# Patient Record
Sex: Female | Born: 1950
Health system: Southern US, Community
[De-identification: ages and names within clinical notes are randomized; demographics above are authoritative.]

## PROBLEM LIST (undated history)

## (undated) DIAGNOSIS — K219 Gastro-esophageal reflux disease without esophagitis: Secondary | ICD-10-CM

## (undated) DIAGNOSIS — A159 Respiratory tuberculosis unspecified: Secondary | ICD-10-CM

## (undated) DIAGNOSIS — M545 Low back pain, unspecified: Secondary | ICD-10-CM

## (undated) DIAGNOSIS — Z889 Allergy status to unspecified drugs, medicaments and biological substances status: Secondary | ICD-10-CM

## (undated) DIAGNOSIS — G8929 Other chronic pain: Secondary | ICD-10-CM

## (undated) DIAGNOSIS — G4730 Sleep apnea, unspecified: Secondary | ICD-10-CM

## (undated) DIAGNOSIS — M1990 Unspecified osteoarthritis, unspecified site: Secondary | ICD-10-CM

## (undated) DIAGNOSIS — E039 Hypothyroidism, unspecified: Secondary | ICD-10-CM

## (undated) HISTORY — PX: BREAST SURGERY: SHX581

## (undated) HISTORY — PX: COCHLEAR IMPLANT: SUR684

## (undated) HISTORY — PX: FRACTURE SURGERY: SHX138

## (undated) HISTORY — PX: NASAL SINUS SURGERY: SHX719

---

## 1999-06-11 ENCOUNTER — Encounter: Admission: RE | Admit: 1999-06-11 | Discharge: 1999-06-11 | Payer: Self-pay | Admitting: Neurosurgery

## 1999-07-31 ENCOUNTER — Encounter: Admission: RE | Admit: 1999-07-31 | Discharge: 1999-07-31 | Payer: Self-pay | Admitting: Neurosurgery

## 1999-08-09 ENCOUNTER — Ambulatory Visit (HOSPITAL_COMMUNITY): Admission: RE | Admit: 1999-08-09 | Discharge: 1999-08-09 | Payer: Self-pay | Admitting: Neurosurgery

## 2000-02-07 ENCOUNTER — Ambulatory Visit (HOSPITAL_COMMUNITY): Admission: RE | Admit: 2000-02-07 | Discharge: 2000-02-07 | Payer: Self-pay | Admitting: General Surgery

## 2000-02-07 ENCOUNTER — Encounter: Payer: Self-pay | Admitting: General Surgery

## 2001-09-17 ENCOUNTER — Encounter: Payer: Self-pay | Admitting: General Surgery

## 2001-09-17 ENCOUNTER — Encounter: Admission: RE | Admit: 2001-09-17 | Discharge: 2001-09-17 | Payer: Self-pay | Admitting: General Surgery

## 2003-09-14 ENCOUNTER — Observation Stay (HOSPITAL_COMMUNITY): Admission: RE | Admit: 2003-09-14 | Discharge: 2003-09-15 | Payer: Self-pay | Admitting: Surgery

## 2013-12-07 ENCOUNTER — Other Ambulatory Visit (HOSPITAL_COMMUNITY): Payer: Self-pay | Admitting: Interventional Radiology

## 2013-12-07 DIAGNOSIS — IMO0002 Reserved for concepts with insufficient information to code with codable children: Secondary | ICD-10-CM

## 2013-12-07 DIAGNOSIS — M549 Dorsalgia, unspecified: Secondary | ICD-10-CM

## 2013-12-09 ENCOUNTER — Ambulatory Visit (HOSPITAL_COMMUNITY)
Admission: RE | Admit: 2013-12-09 | Discharge: 2013-12-09 | Disposition: A | Discharge status: Home / Self Care | Payer: BC Managed Care – PPO | Source: Ambulatory Visit | Attending: Interventional Radiology | Admitting: Interventional Radiology

## 2013-12-09 DIAGNOSIS — M549 Dorsalgia, unspecified: Secondary | ICD-10-CM

## 2013-12-09 DIAGNOSIS — IMO0002 Reserved for concepts with insufficient information to code with codable children: Secondary | ICD-10-CM

## 2014-01-18 ENCOUNTER — Other Ambulatory Visit (HOSPITAL_COMMUNITY): Payer: Self-pay | Admitting: Interventional Radiology

## 2014-01-18 DIAGNOSIS — T148XXA Other injury of unspecified body region, initial encounter: Secondary | ICD-10-CM

## 2014-01-18 DIAGNOSIS — M549 Dorsalgia, unspecified: Secondary | ICD-10-CM

## 2014-01-24 ENCOUNTER — Encounter (HOSPITAL_COMMUNITY)
Admission: RE | Admit: 2014-01-24 | Discharge: 2014-01-24 | Disposition: A | Discharge status: Home / Self Care | Payer: BC Managed Care – PPO | Source: Ambulatory Visit | Attending: Interventional Radiology | Admitting: Interventional Radiology

## 2014-01-24 DIAGNOSIS — M549 Dorsalgia, unspecified: Secondary | ICD-10-CM

## 2014-01-24 DIAGNOSIS — T148XXA Other injury of unspecified body region, initial encounter: Secondary | ICD-10-CM

## 2014-01-24 MED ORDER — TECHNETIUM TC 99M MEDRONATE IV KIT
25.0000 | PACK | Freq: Once | INTRAVENOUS | Status: AC | PRN
  Administered 2014-01-24: 25 via INTRAVENOUS

## 2014-01-26 ENCOUNTER — Telehealth (HOSPITAL_COMMUNITY): Payer: Self-pay | Admitting: Interventional Radiology

## 2014-01-26 NOTE — Telephone Encounter (Signed)
Called pt, no answer and no VM JM 

## 2014-03-09 ENCOUNTER — Telehealth (HOSPITAL_COMMUNITY): Payer: Self-pay | Admitting: Interventional Radiology

## 2014-03-09 NOTE — Telephone Encounter (Signed)
Called pt, gave her the results of her recent bone scan. Told her that per Deveshwar she should see her PCP, no new fractures. She states understanding and is in agreement w/ this plan of care. JM

## 2014-05-03 ENCOUNTER — Other Ambulatory Visit (HOSPITAL_COMMUNITY): Payer: Self-pay | Admitting: Pain Medicine

## 2014-05-03 DIAGNOSIS — T148XXA Other injury of unspecified body region, initial encounter: Secondary | ICD-10-CM

## 2014-05-11 ENCOUNTER — Encounter (HOSPITAL_COMMUNITY): Payer: BC Managed Care – PPO

## 2014-05-12 ENCOUNTER — Ambulatory Visit (HOSPITAL_COMMUNITY)
Admission: RE | Admit: 2014-05-12 | Discharge: 2014-05-12 | Disposition: A | Discharge status: Home / Self Care | Payer: BC Managed Care – PPO | Source: Ambulatory Visit | Attending: Pain Medicine | Admitting: Pain Medicine

## 2014-05-12 DIAGNOSIS — M549 Dorsalgia, unspecified: Secondary | ICD-10-CM | POA: Insufficient documentation

## 2014-05-12 DIAGNOSIS — T148XXA Other injury of unspecified body region, initial encounter: Secondary | ICD-10-CM

## 2014-05-12 MED ORDER — TECHNETIUM TC 99M MEDRONATE IV KIT
24.5000 | PACK | Freq: Once | INTRAVENOUS | Status: AC | PRN
  Administered 2014-05-12: 24.5 via INTRAVENOUS

## 2016-01-14 ENCOUNTER — Inpatient Hospital Stay: Admit: 2016-01-14 | Payer: Self-pay | Admitting: Orthopedic Surgery

## 2016-01-14 SURGERY — RELEASE, BURSA, TROCHANTERIC
Anesthesia: Spinal | Site: Hip | Laterality: Right

## 2016-05-14 NOTE — H&P (Signed)
Shannon Conner is an 65 y.o. female.    Chief Complaint: Right hip troch bursitis and gluteal tendon tear   Procedure:   Right hip open bursectomy and gluteal tendon repair  HPI: Pt is a 65 y.o. female complaining of right hip pain for 10-12 years. Pain had continually increased since the beginning. Imaging studies reveal  right hip gluteal tendon tear. Pt has tried various conservative treatments which have failed to alleviate their symptoms, including NSAIDs, cortisone injections and activity modification. Various options are discussed with the patient. Risks, benefits and expectations were discussed with the patient. Patient understand the risks, benefits and expectations and wishes to proceed with surgery.    PCP: Lucila [place], MD  D/C Plans:      Home  Post-op Meds:       No Rx given  Tranexamic Acid:      To be given - IV   Decadron:      Is to be given  FYI:     ASA  Norco    Past Medical History:  Diagnosis Date  . Arthritis    RA  . Chronic right-sided low back pain   . GERD (gastroesophageal reflux disease)   . History of seasonal allergies   . Hypothyroidism   . Sleep apnea    "mild"- no tx  . Tuberculosis       Past Surgical History:  Procedure Laterality Date  . BREAST SURGERY Bilateral    cyst removal one, "tumor removed from the other"  . COCHLEAR IMPLANT Right    x 2  . FRACTURE SURGERY     nose  . NASAL SINUS SURGERY     x 4/states sever back pain following this most recent one- 9/17    Social History:  reports that she has quit smoking. She has never used smokeless tobacco. She reports that she does not drink alcohol or use drugs.   Allergies  Allergen Reactions  . Ibuprofen Palpitations and Shortness Of Breath  . Meclofenamate Shortness Of Breath  . Propoxyphene Shortness Of Breath, Palpitations and Other (See Comments)  . Acetaminophen     Decreased liver function    Medications: No current facility-administered medications  for this encounter.    Current Outpatient Prescriptions  Medication Sig Dispense Refill  . Abatacept (ORENCIA) 125 MG/ML SOSY Inject 125 mg into the skin once a week.    Marland Kitchen amitriptyline (ELAVIL) 50 MG tablet Take 50 mg by mouth at bedtime.    . Calcium-Magnesium-Vitamin D (CALCIUM 1200+D3 PO) Take 1 tablet by mouth at bedtime.    . Cholecalciferol (VITAMIN D3) 2000 units TABS Take 2,000 Units by mouth daily.    . fluticasone (FLONASE) 50 MCG/ACT nasal spray Place 1-2 sprays into both nostrils daily as needed for allergies or rhinitis.    Marland Kitchen gabapentin (NEURONTIN) 600 MG tablet Take 600 mg by mouth at bedtime.    Marland Kitchen levothyroxine (SYNTHROID, LEVOTHROID) 88 MCG tablet Take 88 mcg by mouth daily before breakfast.    . loratadine (CLARITIN) 10 MG tablet Take 10 mg by mouth daily as needed for allergies.    . Multiple Vitamins-Minerals (ICAPS AREDS 2) CAPS Take 1 capsule by mouth 2 (two) times daily.    . naproxen sodium (ANAPROX) 220 MG tablet Take 220-440 mg by mouth daily as needed (pain).    Marland Kitchen omeprazole (PRILOSEC) 20 MG capsule Take 20 mg by mouth 2 (two) times daily.    . polycarbophil (FIBERCON) 625 MG tablet Take  625 mg by mouth 2 (two) times daily.    Bertram Gala Glycol-Propyl Glycol (SYSTANE ULTRA OP) Apply 1 drop to eye daily as needed (dry eyes).    . risedronate (ACTONEL) 150 MG tablet Take 150 mg by mouth every 30 (thirty) days. On the 17th of each month    . rosuvastatin (CRESTOR) 20 MG tablet Take 20 mg by mouth every evening.    . sodium chloride (OCEAN) 0.65 % SOLN nasal spray Place 1 spray into both nostrils as needed for congestion.    Marland Kitchen tiZANidine (ZANAFLEX) 4 MG tablet Take 4 mg by mouth 3 (three) times daily as needed for muscle spasms.   5  . vitamin B-12 (CYANOCOBALAMIN) 1000 MCG tablet Take 1,000 mcg by mouth every 30 (thirty) days.         Review of Systems  Constitutional: Negative.   HENT: Positive for hearing loss.   Eyes: Negative.   Respiratory: Negative.     Cardiovascular: Negative.   Gastrointestinal: Positive for heartburn.  Genitourinary: Negative.   Musculoskeletal: Positive for back pain and joint pain.  Skin: Negative.   Neurological: Negative.   Endo/Heme/Allergies: Positive for environmental allergies.  Psychiatric/Behavioral: Negative.        Physical Exam  Constitutional: She is oriented to person, place, and time. She appears well-developed.  HENT:  Head: Normocephalic.  Eyes: Pupils are equal, round, and reactive to light.  Neck: Neck supple. No JVD present. No tracheal deviation present. No thyromegaly present.  Cardiovascular: Normal rate, regular rhythm, normal heart sounds and intact distal pulses.   Respiratory: Effort normal and breath sounds normal. No respiratory distress. She has no wheezes.  GI: Soft. There is no tenderness. There is no guarding.  Musculoskeletal:       Right hip: She exhibits decreased range of motion, decreased strength, tenderness and bony tenderness. She exhibits no swelling, no deformity and no laceration.  Lymphadenopathy:    She has no cervical adenopathy.  Neurological: She is alert and oriented to person, place, and time.  Skin: Skin is warm and dry.  Psychiatric: She has a normal mood and affect.       Assessment/Plan Assessment: Right hip troch bursitis and gluteal tendon tear   Plan: Patient will undergo a  right hip open bursectomy and gluteal tendon repair on 05/26/2016 per Dr. Charlann Boxer at [facility]. Risks benefits and expectations were discussed with the patient. Patient understand risks, benefits and expectations and wishes to proceed.    Anastasio Auerbach Deshanti Adcox   PA-C  05/21/2016, 9:55 AM

## 2016-05-16 NOTE — Patient Instructions (Addendum)
Shannon Conner    Your procedure is scheduled on: 05/26/16  Report to [facility] Main  Entrance take [facility]  elevators to 3rd floor to  Short Stay Center at  0900 AM.  Call this number if you have problems the morning of surgery [phone]   Remember: ONLY 1 PERSON MAY GO WITH YOU TO SHORT STAY TO GET  READY MORNING OF YOUR SURGERY.  Do not eat food or drink liquids :After Midnight.     Take these medicines the morning of surgery with A SIP OF WATER:Levothyrexine , Omepazole May take  Clairitin if needed, Tinaflex, or use nasal spray if needed DO NOT TAKE ANY DIABETIC MEDICATIONS DAY OF YOUR SURGERY                               You may not have any metal on your body including hair pins and              piercings  Do not wear jewelry, make-up, lotions, powders or perfumes, deodorant             Do not wear nail polish.  Do not shave  48 hours prior to surgery.              Men may shave face and neck.   Do not bring valuables to the hospital. [place] IS NOT             RESPONSIBLE   FOR VALUABLES.  Contacts, dentures or bridgework may not be worn into surgery.  Leave suitcase in the car. After surgery it may be brought to your room.             Please read over the following fact sheets you were given: _____________________________________________________________________             [facility] - Preparing for Surgery Before surgery, you can play an important role.  Because skin is not sterile, your skin needs to be as free of germs as possible.  You can reduce the number of germs on your skin by washing with CHG (chlorahexidine gluconate) soap before surgery.  CHG is an antiseptic cleaner which kills germs and bonds with the skin to continue killing germs even after washing. Please DO NOT use if you have an allergy to CHG or antibacterial soaps.  If your skin becomes reddened/irritated stop using the CHG and inform your nurse when you arrive at Short  Stay. Do not shave (including legs and underarms) for at least 48 hours prior to the first CHG shower.  You may shave your face/neck. Please follow these instructions carefully:  1.  Shower with CHG Soap the night before surgery and the  morning of Surgery.  2.  If you choose to wash your hair, wash your hair first as usual with your  normal  shampoo.  3.  After you shampoo, rinse your hair and body thoroughly to remove the  shampoo.                           4.  Use CHG as you would any other liquid soap.  You can apply chg directly  to the skin and wash  Gently with a scrungie or clean washcloth.  5.  Apply the CHG Soap to your body ONLY FROM THE NECK DOWN.   Do not use on face/ open                           Wound or open sores. Avoid contact with eyes, ears mouth and genitals (private parts).                       Wash face,  Genitals (private parts) with your normal soap.             6.  Wash thoroughly, paying special attention to the area where your surgery  will be performed.  7.  Thoroughly rinse your body with warm water from the neck down.  8.  DO NOT shower/wash with your normal soap after using and rinsing off  the CHG Soap.                9.  Pat yourself dry with a clean towel.            10.  Wear clean pajamas.            11.  Place clean sheets on your bed the night of your first shower and do not  sleep with pets. Day of Surgery : Do not apply any lotions/deodorants the morning of surgery.  Please wear clean clothes to the hospital/surgery center.  FAILURE TO FOLLOW THESE INSTRUCTIONS MAY RESULT IN THE CANCELLATION OF YOUR SURGERY PATIENT SIGNATURE_________________________________  NURSE SIGNATURE__________________________________  ________________________________________________________________________   Adam Phenix  An incentive spirometer is a tool that can help keep your lungs clear and active. This tool measures how well you are  filling your lungs with each breath. Taking long deep breaths may help reverse or decrease the chance of developing breathing (pulmonary) problems (especially infection) following:  A long period of time when you are unable to move or be active. BEFORE THE PROCEDURE   If the spirometer includes an indicator to show your best effort, your nurse or respiratory therapist will set it to a desired goal.  If possible, sit up straight or lean slightly forward. Try not to slouch.  Hold the incentive spirometer in an upright position. INSTRUCTIONS FOR USE  1. Sit on the edge of your bed if possible, or sit up as far as you can in bed or on a chair. 2. Hold the incentive spirometer in an upright position. 3. Breathe out normally. 4. Place the mouthpiece in your mouth and seal your lips tightly around it. 5. Breathe in slowly and as deeply as possible, raising the piston or the ball toward the top of the column. 6. Hold your breath for 3-5 seconds or for as long as possible. Allow the piston or ball to fall to the bottom of the column. 7. Remove the mouthpiece from your mouth and breathe out normally. 8. Rest for a few seconds and repeat Steps 1 through 7 at least 10 times every 1-2 hours when you are awake. Take your time and take a few normal breaths between deep breaths. 9. The spirometer may include an indicator to show your best effort. Use the indicator as a goal to work toward during each repetition. 10. After each set of 10 deep breaths, practice coughing to be sure your lungs are clear. If you have an incision (the cut made at the time of surgery),  support your incision when coughing by placing a pillow or rolled up towels firmly against it. Once you are able to get out of bed, walk around indoors and cough well. You may stop using the incentive spirometer when instructed by your caregiver.  RISKS AND COMPLICATIONS  Take your time so you do not get dizzy or light-headed.  If you are in pain,  you may need to take or ask for pain medication before doing incentive spirometry. It is harder to take a deep breath if you are having pain. AFTER USE  Rest and breathe slowly and easily.  It can be helpful to keep track of a log of your progress. Your caregiver can provide you with a simple table to help with this. If you are using the spirometer at home, follow these instructions: [place] IF:   You are having difficultly using the spirometer.  You have trouble using the spirometer as often as instructed.  Your pain medication is not giving enough relief while using the spirometer.  You develop fever of 100.5 F (38.1 C) or higher. SEEK IMMEDIATE MEDICAL CARE IF:   You cough up bloody sputum that had not been present before.  You develop fever of 102 F (38.9 C) or greater.  You develop worsening pain at or near the incision site. MAKE SURE YOU:   Understand these instructions.  Will watch your condition.  Will get help right away if you are not doing well or get worse. Document Released: 11/10/2006 Document Revised: 09/22/2011 Document Reviewed: 01/11/2007 ExitCare Patient Information 2014 [employer], [place].   ________________________________________________________________________  WHAT IS A BLOOD TRANSFUSION? Blood Transfusion Information  A transfusion is the replacement of blood or some of its parts. Blood is made up of multiple cells which provide different functions.  Red blood cells carry oxygen and are used for blood loss replacement.  White blood cells fight against infection.  Platelets control bleeding.  Plasma helps clot blood.  Other blood products are available for specialized needs, such as hemophilia or other clotting disorders. BEFORE THE TRANSFUSION  Who gives blood for transfusions?   Healthy volunteers who are fully evaluated to make sure their blood is safe. This is blood bank blood. Transfusion therapy is the safest it has ever  been in the practice of medicine. Before blood is taken from a donor, a complete history is taken to make sure that person has no history of diseases nor engages in risky social behavior (examples are intravenous drug use or sexual activity with multiple partners). The donor's travel history is screened to minimize risk of transmitting infections, such as malaria. The donated blood is tested for signs of infectious diseases, such as HIV and hepatitis. The blood is then tested to be sure it is compatible with you in order to minimize the chance of a transfusion reaction. If you or a relative donates blood, this is often done in anticipation of surgery and is not appropriate for emergency situations. It takes many days to process the donated blood. RISKS AND COMPLICATIONS Although transfusion therapy is very safe and saves many lives, the main dangers of transfusion include:   Getting an infectious disease.  Developing a transfusion reaction. This is an allergic reaction to something in the blood you were given. Every precaution is taken to prevent this. The decision to have a blood transfusion has been considered carefully by your caregiver before blood is given. Blood is not given unless the benefits outweigh the risks. AFTER THE TRANSFUSION  Right after receiving a blood transfusion, you will usually feel much better and more energetic. This is especially true if your red blood cells have gotten low (anemic). The transfusion raises the level of the red blood cells which carry oxygen, and this usually causes an energy increase.  The nurse administering the transfusion will monitor you carefully for complications. HOME CARE INSTRUCTIONS  No special instructions are needed after a transfusion. You may find your energy is better. Speak with your caregiver about any limitations on activity for underlying diseases you may have. SEEK MEDICAL CARE IF:   Your condition is not improving after your  transfusion.  You develop redness or irritation at the intravenous (IV) site. SEEK IMMEDIATE MEDICAL CARE IF:  Any of the following symptoms occur over the next 12 hours:  Shaking chills.  You have a temperature by mouth above 102 F (38.9 C), not controlled by medicine.  Chest, back, or muscle pain.  People around you feel you are not acting correctly or are confused.  Shortness of breath or difficulty breathing.  Dizziness and fainting.  You get a rash or develop hives.  You have a decrease in urine output.  Your urine turns a dark color or changes to pink, red, or brown. Any of the following symptoms occur over the next 10 days:  You have a temperature by mouth above 102 F (38.9 C), not controlled by medicine.  Shortness of breath.  Weakness after normal activity.  The white part of the eye turns yellow (jaundice).  You have a decrease in the amount of urine or are urinating less often.  Your urine turns a dark color or changes to pink, red, or brown. Document Released: 06/27/2000 Document Revised: 09/22/2011 Document Reviewed: 02/14/2008 [facility] Patient Information 2014 [place], [place].  _______________________________________________________________________

## 2016-05-19 ENCOUNTER — Encounter (HOSPITAL_COMMUNITY): Payer: Self-pay

## 2016-05-19 ENCOUNTER — Encounter (HOSPITAL_COMMUNITY)
Admission: RE | Admit: 2016-05-19 | Discharge: 2016-05-19 | Disposition: A | Discharge status: Home / Self Care | Payer: BLUE CROSS/BLUE SHIELD | Source: Ambulatory Visit | Attending: Orthopedic Surgery | Admitting: Orthopedic Surgery

## 2016-05-19 DIAGNOSIS — Z01812 Encounter for preprocedural laboratory examination: Secondary | ICD-10-CM | POA: Insufficient documentation

## 2016-05-19 HISTORY — DX: Gastro-esophageal reflux disease without esophagitis: K21.9

## 2016-05-19 HISTORY — DX: Hypothyroidism, unspecified: E03.9

## 2016-05-19 HISTORY — DX: Unspecified osteoarthritis, unspecified site: M19.90

## 2016-05-19 HISTORY — DX: Respiratory tuberculosis unspecified: A15.9

## 2016-05-19 HISTORY — DX: Sleep apnea, unspecified: G47.30

## 2016-05-19 HISTORY — DX: Allergy status to unspecified drugs, medicaments and biological substances: Z88.9

## 2016-05-19 HISTORY — DX: Low back pain, unspecified: M54.50

## 2016-05-19 HISTORY — DX: Other chronic pain: G89.29

## 2016-05-19 HISTORY — DX: Low back pain: M54.5

## 2016-05-19 LAB — CBC
HCT: 40.4 % (ref 36.0–46.0)
HEMOGLOBIN: 12.9 g/dL (ref 12.0–15.0)
MCH: 29.1 pg (ref 26.0–34.0)
MCHC: 31.9 g/dL (ref 30.0–36.0)
MCV: 91 fL (ref 78.0–100.0)
Platelets: 222 10*3/uL (ref 150–400)
RBC: 4.44 MIL/uL (ref 3.87–5.11)
RDW: 13.5 % (ref 11.5–15.5)
WBC: 5.9 10*3/uL (ref 4.0–10.5)

## 2016-05-19 LAB — ABO/RH: ABO/RH(D): A POS

## 2016-05-19 NOTE — Progress Notes (Signed)
Chest, ekg, lov dr Lorin Picket on chart

## 2016-05-26 ENCOUNTER — Inpatient Hospital Stay (HOSPITAL_COMMUNITY): Payer: BLUE CROSS/BLUE SHIELD | Admitting: Certified Registered Nurse Anesthetist

## 2016-05-26 ENCOUNTER — Observation Stay (HOSPITAL_COMMUNITY)
Admission: RE | Admit: 2016-05-26 | Discharge: 2016-05-27 | Disposition: A | Discharge status: Home / Self Care | Payer: BLUE CROSS/BLUE SHIELD | Source: Ambulatory Visit | Attending: Orthopedic Surgery | Admitting: Orthopedic Surgery

## 2016-05-26 ENCOUNTER — Encounter (HOSPITAL_COMMUNITY): Payer: Self-pay

## 2016-05-26 ENCOUNTER — Encounter (HOSPITAL_COMMUNITY)
Admission: RE | Disposition: A | Discharge status: Home / Self Care | Payer: Self-pay | Source: Ambulatory Visit | Attending: Orthopedic Surgery

## 2016-05-26 DIAGNOSIS — M62838 Other muscle spasm: Secondary | ICD-10-CM | POA: Insufficient documentation

## 2016-05-26 DIAGNOSIS — M069 Rheumatoid arthritis, unspecified: Secondary | ICD-10-CM | POA: Diagnosis not present

## 2016-05-26 DIAGNOSIS — M898X8 Other specified disorders of bone, other site: Secondary | ICD-10-CM | POA: Insufficient documentation

## 2016-05-26 DIAGNOSIS — Z886 Allergy status to analgesic agent status: Secondary | ICD-10-CM | POA: Diagnosis not present

## 2016-05-26 DIAGNOSIS — E039 Hypothyroidism, unspecified: Secondary | ICD-10-CM | POA: Insufficient documentation

## 2016-05-26 DIAGNOSIS — G4730 Sleep apnea, unspecified: Secondary | ICD-10-CM | POA: Diagnosis not present

## 2016-05-26 DIAGNOSIS — M7061 Trochanteric bursitis, right hip: Secondary | ICD-10-CM | POA: Diagnosis not present

## 2016-05-26 DIAGNOSIS — K219 Gastro-esophageal reflux disease without esophagitis: Secondary | ICD-10-CM | POA: Insufficient documentation

## 2016-05-26 DIAGNOSIS — Z8611 Personal history of tuberculosis: Secondary | ICD-10-CM | POA: Diagnosis not present

## 2016-05-26 DIAGNOSIS — S76011D Strain of muscle, fascia and tendon of right hip, subsequent encounter: Secondary | ICD-10-CM

## 2016-05-26 DIAGNOSIS — M6289 Other specified disorders of muscle: Secondary | ICD-10-CM | POA: Insufficient documentation

## 2016-05-26 DIAGNOSIS — S76011A Strain of muscle, fascia and tendon of right hip, initial encounter: Secondary | ICD-10-CM

## 2016-05-26 DIAGNOSIS — Z7982 Long term (current) use of aspirin: Secondary | ICD-10-CM | POA: Diagnosis not present

## 2016-05-26 DIAGNOSIS — Z888 Allergy status to other drugs, medicaments and biological substances status: Secondary | ICD-10-CM | POA: Diagnosis not present

## 2016-05-26 DIAGNOSIS — H04123 Dry eye syndrome of bilateral lacrimal glands: Secondary | ICD-10-CM | POA: Diagnosis not present

## 2016-05-26 HISTORY — DX: Strain of muscle, fascia and tendon of right hip, initial encounter: S76.011A

## 2016-05-26 HISTORY — PX: EXCISION/RELEASE BURSA HIP: SHX5014

## 2016-05-26 LAB — TYPE AND SCREEN
ABO/RH(D): A POS
ANTIBODY SCREEN: NEGATIVE

## 2016-05-26 SURGERY — RELEASE, BURSA, TROCHANTERIC
Anesthesia: General | Site: Hip | Laterality: Right

## 2016-05-26 MED ORDER — MIDAZOLAM HCL 5 MG/5ML IJ SOLN
INTRAMUSCULAR | Status: DC | PRN
  Administered 2016-05-26: 2 mg via INTRAVENOUS

## 2016-05-26 MED ORDER — POLYETHYLENE GLYCOL 3350 17 G PO PACK
17.0000 g | PACK | Freq: Two times a day (BID) | ORAL | Status: DC
  Administered 2016-05-26 – 2016-05-27 (×2): 17 g via ORAL
  Filled 2016-05-26 (×2): qty 1

## 2016-05-26 MED ORDER — SODIUM CHLORIDE 0.9 % IV SOLN
100.0000 mL/h | INTRAVENOUS | Status: DC
  Administered 2016-05-26 – 2016-05-27 (×2): 100 mL/h via INTRAVENOUS
  Filled 2016-05-26 (×6): qty 1000

## 2016-05-26 MED ORDER — METHOCARBAMOL 500 MG PO TABS: 500.0000 mg | ORAL_TABLET | Freq: Four times a day (QID) | ORAL | Status: DC | PRN

## 2016-05-26 MED ORDER — NON FORMULARY: 20.0000 mg | Freq: Two times a day (BID) | Status: DC

## 2016-05-26 MED ORDER — FLUTICASONE PROPIONATE 50 MCG/ACT NA SUSP
1.0000 | Freq: Every day | NASAL | Status: DC | PRN
  Filled 2016-05-26: qty 16

## 2016-05-26 MED ORDER — PROPOFOL 10 MG/ML IV BOLUS
INTRAVENOUS | Status: DC | PRN
  Administered 2016-05-26: 140 mg via INTRAVENOUS

## 2016-05-26 MED ORDER — MENTHOL 3 MG MT LOZG
1.0000 | LOZENGE | OROMUCOSAL | Status: DC | PRN
  Filled 2016-05-26: qty 9

## 2016-05-26 MED ORDER — AMITRIPTYLINE HCL 50 MG PO TABS
50.0000 mg | ORAL_TABLET | Freq: Every day | ORAL | Status: DC
  Administered 2016-05-26: 50 mg via ORAL
  Filled 2016-05-26: qty 1

## 2016-05-26 MED ORDER — PHENOL 1.4 % MT LIQD: 1.0000 | OROMUCOSAL | Status: DC | PRN

## 2016-05-26 MED ORDER — 0.9 % SODIUM CHLORIDE (POUR BTL) OPTIME
TOPICAL | Status: DC | PRN
  Administered 2016-05-26: 1000 mL

## 2016-05-26 MED ORDER — EPHEDRINE SULFATE 50 MG/ML IJ SOLN
INTRAMUSCULAR | Status: DC | PRN
  Administered 2016-05-26: 15 mg via INTRAVENOUS
  Administered 2016-05-26: 10 mg via INTRAVENOUS
  Administered 2016-05-26: 15 mg via INTRAVENOUS
  Administered 2016-05-26 (×3): 10 mg via INTRAVENOUS

## 2016-05-26 MED ORDER — PHENYLEPHRINE HCL 10 MG/ML IJ SOLN
INTRAMUSCULAR | Status: DC | PRN
  Administered 2016-05-26: 80 ug via INTRAVENOUS
  Administered 2016-05-26: 120 ug via INTRAVENOUS
  Administered 2016-05-26: 80 ug via INTRAVENOUS

## 2016-05-26 MED ORDER — DEXAMETHASONE SODIUM PHOSPHATE 10 MG/ML IJ SOLN
10.0000 mg | Freq: Once | INTRAMUSCULAR | Status: AC
  Administered 2016-05-26: 10 mg via INTRAVENOUS

## 2016-05-26 MED ORDER — ALUM & MAG HYDROXIDE-SIMETH 200-200-20 MG/5ML PO SUSP: 30.0000 mL | ORAL | Status: DC | PRN

## 2016-05-26 MED ORDER — GABAPENTIN 300 MG PO CAPS
600.0000 mg | ORAL_CAPSULE | Freq: Every day | ORAL | Status: DC
  Administered 2016-05-26: 600 mg via ORAL
  Filled 2016-05-26: qty 2

## 2016-05-26 MED ORDER — LACTATED RINGERS IV SOLN
INTRAVENOUS | Status: DC
  Administered 2016-05-26: 11:00:00 via INTRAVENOUS

## 2016-05-26 MED ORDER — ASPIRIN 81 MG PO CHEW
81.0000 mg | CHEWABLE_TABLET | Freq: Two times a day (BID) | ORAL | Status: DC
  Administered 2016-05-26 – 2016-05-27 (×2): 81 mg via ORAL
  Filled 2016-05-26 (×2): qty 1

## 2016-05-26 MED ORDER — FENTANYL CITRATE (PF) 100 MCG/2ML IJ SOLN
25.0000 ug | INTRAMUSCULAR | Status: DC | PRN
  Administered 2016-05-26 (×2): 50 ug via INTRAVENOUS

## 2016-05-26 MED ORDER — BISACODYL 10 MG RE SUPP: 10.0000 mg | Freq: Every day | RECTAL | Status: DC | PRN

## 2016-05-26 MED ORDER — HYDROMORPHONE HCL 1 MG/ML IJ SOLN
0.5000 mg | INTRAMUSCULAR | Status: DC | PRN
  Administered 2016-05-26: 1 mg via INTRAVENOUS
  Filled 2016-05-26: qty 1

## 2016-05-26 MED ORDER — DIPHENHYDRAMINE HCL 25 MG PO CAPS: 25.0000 mg | ORAL_CAPSULE | Freq: Four times a day (QID) | ORAL | Status: DC | PRN

## 2016-05-26 MED ORDER — SUGAMMADEX SODIUM 500 MG/5ML IV SOLN
INTRAVENOUS | Status: DC | PRN
  Administered 2016-05-26: 300 mg via INTRAVENOUS

## 2016-05-26 MED ORDER — METHOCARBAMOL 1000 MG/10ML IJ SOLN
500.0000 mg | Freq: Four times a day (QID) | INTRAVENOUS | Status: DC | PRN
  Administered 2016-05-26: 500 mg via INTRAVENOUS
  Filled 2016-05-26: qty 5
  Filled 2016-05-26: qty 550

## 2016-05-26 MED ORDER — DOCUSATE SODIUM 100 MG PO CAPS
100.0000 mg | ORAL_CAPSULE | Freq: Two times a day (BID) | ORAL | Status: DC
  Administered 2016-05-26 – 2016-05-27 (×2): 100 mg via ORAL
  Filled 2016-05-26 (×2): qty 1

## 2016-05-26 MED ORDER — CEFAZOLIN SODIUM-DEXTROSE 2-4 GM/100ML-% IV SOLN
2.0000 g | Freq: Four times a day (QID) | INTRAVENOUS | Status: AC
  Administered 2016-05-26 – 2016-05-27 (×2): 2 g via INTRAVENOUS
  Filled 2016-05-26 (×2): qty 100

## 2016-05-26 MED ORDER — ONDANSETRON HCL 4 MG PO TABS: 4.0000 mg | ORAL_TABLET | Freq: Four times a day (QID) | ORAL | Status: DC | PRN

## 2016-05-26 MED ORDER — SODIUM CHLORIDE 0.9 % IV SOLN
1000.0000 mg | INTRAVENOUS | Status: AC
  Administered 2016-05-26: 1000 mg via INTRAVENOUS
  Filled 2016-05-26: qty 10

## 2016-05-26 MED ORDER — MEPERIDINE HCL 50 MG/ML IJ SOLN: 6.2500 mg | INTRAMUSCULAR | Status: DC | PRN

## 2016-05-26 MED ORDER — ONDANSETRON HCL 4 MG/2ML IJ SOLN
INTRAMUSCULAR | Status: DC | PRN
  Administered 2016-05-26: 4 mg via INTRAVENOUS

## 2016-05-26 MED ORDER — FERROUS SULFATE 325 (65 FE) MG PO TABS
325.0000 mg | ORAL_TABLET | Freq: Three times a day (TID) | ORAL | Status: DC
  Administered 2016-05-26 – 2016-05-27 (×3): 325 mg via ORAL
  Filled 2016-05-26 (×3): qty 1

## 2016-05-26 MED ORDER — MAGNESIUM CITRATE PO SOLN
1.0000 | Freq: Once | ORAL | Status: DC | PRN
Start: 2016-05-26 — End: 2016-05-27

## 2016-05-26 MED ORDER — CEFAZOLIN SODIUM-DEXTROSE 2-4 GM/100ML-% IV SOLN
INTRAVENOUS | Status: AC
  Filled 2016-05-26: qty 100

## 2016-05-26 MED ORDER — METOCLOPRAMIDE HCL 5 MG PO TABS: 5.0000 mg | ORAL_TABLET | Freq: Three times a day (TID) | ORAL | Status: DC | PRN

## 2016-05-26 MED ORDER — LACTATED RINGERS IV SOLN: INTRAVENOUS | Status: DC

## 2016-05-26 MED ORDER — LEVOTHYROXINE SODIUM 88 MCG PO TABS
88.0000 ug | ORAL_TABLET | Freq: Every day | ORAL | Status: DC
  Administered 2016-05-27: 88 ug via ORAL
  Filled 2016-05-26: qty 1

## 2016-05-26 MED ORDER — FENTANYL CITRATE (PF) 100 MCG/2ML IJ SOLN
INTRAMUSCULAR | Status: DC | PRN
  Administered 2016-05-26 (×3): 50 ug via INTRAVENOUS

## 2016-05-26 MED ORDER — FENTANYL CITRATE (PF) 100 MCG/2ML IJ SOLN
INTRAMUSCULAR | Status: AC
  Filled 2016-05-26: qty 2

## 2016-05-26 MED ORDER — METOCLOPRAMIDE HCL 5 MG/ML IJ SOLN: 10.0000 mg | Freq: Once | INTRAMUSCULAR | Status: DC | PRN

## 2016-05-26 MED ORDER — OMEPRAZOLE 20 MG PO CPDR
20.0000 mg | DELAYED_RELEASE_CAPSULE | Freq: Two times a day (BID) | ORAL | Status: DC
  Administered 2016-05-26 – 2016-05-27 (×2): 20 mg via ORAL
  Filled 2016-05-26 (×2): qty 1

## 2016-05-26 MED ORDER — OXYCODONE HCL 5 MG PO TABS
5.0000 mg | ORAL_TABLET | ORAL | Status: DC
  Administered 2016-05-26: 5 mg via ORAL
  Administered 2016-05-26 – 2016-05-27 (×5): 10 mg via ORAL
  Filled 2016-05-26 (×5): qty 2
  Filled 2016-05-26: qty 1

## 2016-05-26 MED ORDER — LORATADINE 10 MG PO TABS: 10.0000 mg | ORAL_TABLET | Freq: Every day | ORAL | Status: DC | PRN

## 2016-05-26 MED ORDER — ROSUVASTATIN CALCIUM 20 MG PO TABS
20.0000 mg | ORAL_TABLET | Freq: Every evening | ORAL | Status: DC
  Administered 2016-05-26: 20 mg via ORAL
  Filled 2016-05-26: qty 1

## 2016-05-26 MED ORDER — DEXAMETHASONE SODIUM PHOSPHATE 10 MG/ML IJ SOLN
10.0000 mg | Freq: Once | INTRAMUSCULAR | Status: AC
  Administered 2016-05-27: 10 mg via INTRAVENOUS
  Filled 2016-05-26: qty 1

## 2016-05-26 MED ORDER — CEFAZOLIN SODIUM-DEXTROSE 2-4 GM/100ML-% IV SOLN
2.0000 g | INTRAVENOUS | Status: AC
  Administered 2016-05-26: 2 g via INTRAVENOUS

## 2016-05-26 MED ORDER — CHLORHEXIDINE GLUCONATE 4 % EX LIQD: 60.0000 mL | Freq: Once | CUTANEOUS | Status: DC

## 2016-05-26 MED ORDER — ONDANSETRON HCL 4 MG/2ML IJ SOLN: 4.0000 mg | Freq: Four times a day (QID) | INTRAMUSCULAR | Status: DC | PRN

## 2016-05-26 MED ORDER — METOCLOPRAMIDE HCL 5 MG/ML IJ SOLN: 5.0000 mg | Freq: Three times a day (TID) | INTRAMUSCULAR | Status: DC | PRN

## 2016-05-26 MED ORDER — LIDOCAINE HCL (CARDIAC) 20 MG/ML IV SOLN
INTRAVENOUS | Status: DC | PRN
  Administered 2016-05-26: 50 mg via INTRAVENOUS

## 2016-05-26 MED ORDER — ROCURONIUM BROMIDE 100 MG/10ML IV SOLN
INTRAVENOUS | Status: DC | PRN
  Administered 2016-05-26: 50 mg via INTRAVENOUS

## 2016-05-26 SURGICAL SUPPLY — 42 items
BAG ZIPLOCK 12X15 (MISCELLANEOUS) ×2 IMPLANT
DERMABOND ADVANCED (GAUZE/BANDAGES/DRESSINGS) ×1
DERMABOND ADVANCED .7 DNX12 (GAUZE/BANDAGES/DRESSINGS) ×1 IMPLANT
DRAPE ORTHO SPLIT 77X108 STRL (DRAPES) ×1
DRAPE SURG 17X11 SM STRL (DRAPES) ×2 IMPLANT
DRAPE SURG ORHT 6 SPLT 77X108 (DRAPES) ×1 IMPLANT
DRAPE U-SHAPE 47X51 STRL (DRAPES) ×2 IMPLANT
DRSG AQUACEL AG ADV 3.5X10 (GAUZE/BANDAGES/DRESSINGS) ×2 IMPLANT
DRSG EMULSION OIL 3X16 NADH (GAUZE/BANDAGES/DRESSINGS) ×2 IMPLANT
DRSG MEPILEX BORDER 4X4 (GAUZE/BANDAGES/DRESSINGS) ×2 IMPLANT
DRSG MEPILEX BORDER 4X8 (GAUZE/BANDAGES/DRESSINGS) ×2 IMPLANT
DRSG PAD ABDOMINAL 8X10 ST (GAUZE/BANDAGES/DRESSINGS) ×2 IMPLANT
DURAPREP 26ML APPLICATOR (WOUND CARE) ×2 IMPLANT
ELECT REM PT RETURN 9FT ADLT (ELECTROSURGICAL) ×2
ELECTRODE REM PT RTRN 9FT ADLT (ELECTROSURGICAL) ×1 IMPLANT
GAUZE SPONGE 4X4 12PLY STRL (GAUZE/BANDAGES/DRESSINGS) ×2 IMPLANT
GLOVE BIOGEL M 7.0 STRL (GLOVE) IMPLANT
GLOVE BIOGEL PI IND STRL 7.5 (GLOVE) ×4 IMPLANT
GLOVE BIOGEL PI IND STRL 8.5 (GLOVE) IMPLANT
GLOVE BIOGEL PI INDICATOR 7.5 (GLOVE) ×4
GLOVE BIOGEL PI INDICATOR 8.5 (GLOVE)
GLOVE ECLIPSE 8.0 STRL XLNG CF (GLOVE) ×2 IMPLANT
GLOVE ORTHO TXT STRL SZ7.5 (GLOVE) ×2 IMPLANT
GLOVE SURG ORTHO 8.0 STRL STRW (GLOVE) ×2 IMPLANT
GOWN STRL REUS W/TWL LRG LVL3 (GOWN DISPOSABLE) ×4 IMPLANT
GOWN STRL REUS W/TWL XL LVL3 (GOWN DISPOSABLE) ×2 IMPLANT
KIT BASIN OR (CUSTOM PROCEDURE TRAY) ×2 IMPLANT
MANIFOLD NEPTUNE II (INSTRUMENTS) ×2 IMPLANT
NS IRRIG 1000ML POUR BTL (IV SOLUTION) ×2 IMPLANT
PACK TOTAL JOINT (CUSTOM PROCEDURE TRAY) ×2 IMPLANT
POSITIONER SURGICAL ARM (MISCELLANEOUS) ×2 IMPLANT
STAPLER VISISTAT (STAPLE) IMPLANT
SUT ETHIBOND NAB CT1 #1 30IN (SUTURE) IMPLANT
SUT FIBERWIRE #2 38 T-5 BLUE (SUTURE) ×6
SUT MNCRL AB 4-0 PS2 18 (SUTURE) ×2 IMPLANT
SUT VIC AB 1 CT1 27 (SUTURE) ×3
SUT VIC AB 1 CT1 27XBRD ANTBC (SUTURE) ×3 IMPLANT
SUT VIC AB 2-0 CT1 27 (SUTURE) ×1
SUT VIC AB 2-0 CT1 TAPERPNT 27 (SUTURE) ×1 IMPLANT
SUTURE FIBERWR #2 38 T-5 BLUE (SUTURE) ×3 IMPLANT
TOWEL OR 17X26 10 PK STRL BLUE (TOWEL DISPOSABLE) ×4 IMPLANT
WATER STERILE IRR 1500ML POUR (IV SOLUTION) IMPLANT

## 2016-05-26 NOTE — Transfer of Care (Signed)
Immediate Anesthesia Transfer of Care Note  Patient: Shannon Conner  Procedure(s) Performed: Procedure(s): OPEN BURSECTOMY  AND ILEOTIBIAL BAND RESCESSION (Right)  Patient Location: PACU  Anesthesia Type:General  Level of Consciousness:  sedated, patient cooperative and responds to stimulation  Airway & Oxygen Therapy:Patient Spontanous Breathing and Patient connected to face mask oxgen  Post-op Assessment:  Report given to PACU RN and Post -op Vital signs reviewed and stable  Post vital signs:  Reviewed and stable  Last Vitals:  Vitals:   05/26/16 0915 05/26/16 1310  BP: 138/77 118/70  Pulse: 71 78  Resp: 16 14  Temp: 36.8 C (P) 37.1 C    Complications: No apparent anesthesia complications

## 2016-05-26 NOTE — Anesthesia Postprocedure Evaluation (Signed)
Anesthesia Post Note  Patient: Shannon Conner  Procedure(s) Performed: Procedure(s) (LRB): OPEN BURSECTOMY  AND ILEOTIBIAL BAND RESCESSION (Right)  Patient location during evaluation: PACU Anesthesia Type: General Level of consciousness: awake and alert Pain management: pain level controlled Vital Signs Assessment: post-procedure vital signs reviewed and stable Respiratory status: spontaneous breathing, nonlabored ventilation, respiratory function stable and patient connected to nasal cannula oxygen Cardiovascular status: blood pressure returned to baseline and stable Postop Assessment: no signs of nausea or vomiting Anesthetic complications: no    Last Vitals:  Vitals:   05/26/16 1515 05/26/16 1621  BP: 104/66 104/84  Pulse: 75 74  Resp: 13 16  Temp: 36.3 C 36.3 C    Last Pain:  Vitals:   05/26/16 1621  TempSrc: Oral  PainSc:                  Phillips Grout

## 2016-05-26 NOTE — Interval H&P Note (Signed)
History and Physical Interval Note:  05/26/2016 11:06 AM  Shannon Conner  has presented today for surgery, with the diagnosis of RIGHT HIP TROCH BURSITIS,GLUTEAL TENDON TEAR  The various methods of treatment have been discussed with the patient and family. After consideration of risks, benefits and other options for treatment, the patient has consented to  Procedure(s): OPEN BURSECTOMY  AND GLUTEAL TENDON REPAIR (Right) as a surgical intervention .  The patient's history has been reviewed, patient examined, no change in status, stable for surgery.  I have reviewed the patient's chart and labs.  Questions were answered to the patient's satisfaction.     Shelda Pal

## 2016-05-26 NOTE — Brief Op Note (Signed)
05/26/2016  12:46 PM  PATIENT:  Shannon Conner  65 y.o. female  PRE-OPERATIVE DIAGNOSIS:  Chronic right trochanteric bursitis questionable gluteal tendon tearing at insertion  POST-OPERATIVE DIAGNOSIS:  Chronic right hip bursitis, tight tensor fascia and iliotibial band  PROCEDURE:  Procedure(s): OPEN BURSECTOMY  AND ILIOTIBIAL BAND RESCESSION (Right)  SURGEON:  Surgeon(s) and Role:    * Durene Romans, MD - Primary  PHYSICIAN ASSISTANT: Leilani Able, PA-C  ANESTHESIA:   general  EBL:  No intake/output data recorded.  BLOOD ADMINISTERED:none  DRAINS: none   LOCAL MEDICATIONS USED:  NONE  SPECIMEN:  No Specimen  DISPOSITION OF SPECIMEN:  N/A  COUNTS:  YES  TOURNIQUET:  * No tourniquets in log *  DICTATION: .Other Dictation:no number provided  PLAN OF CARE: Admit for overnight observation  PATIENT DISPOSITION:  PACU - hemodynamically stable.   Delay start of Pharmacological VTE agent (>24hrs) due to surgical blood loss or risk of bleeding: no

## 2016-05-26 NOTE — Anesthesia Procedure Notes (Signed)
Procedure Name: Intubation Date/Time: 05/26/2016 12:06 PM Performed by: Wynonia Sours Pre-anesthesia Checklist: Patient identified, Emergency Drugs available, Suction available, Patient being monitored and Timeout performed Patient Re-evaluated:Patient Re-evaluated prior to inductionOxygen Delivery Method: Circle system utilized Preoxygenation: Pre-oxygenation with 100% oxygen Intubation Type: IV induction Ventilation: Mask ventilation without difficulty Laryngoscope Size: Mac and 4 Grade View: Grade III Tube type: Oral Tube size: 7.0 mm Number of attempts: 1 Airway Equipment and Method: Stylet Placement Confirmation: ETT inserted through vocal cords under direct vision,  positive ETCO2,  CO2 detector and breath sounds checked- equal and bilateral Secured at: 21 cm Tube secured with: Tape Dental Injury: Teeth and Oropharynx as per pre-operative assessment

## 2016-05-26 NOTE — Anesthesia Preprocedure Evaluation (Signed)
Anesthesia Evaluation  Patient identified by MRN, date of birth, ID band Patient awake    Reviewed: Allergy & Precautions, NPO status , Patient's Chart, lab work & pertinent test results  Airway Mallampati: II  TM Distance: >3 FB Neck ROM: Full    Dental no notable dental hx.    Pulmonary sleep apnea , former smoker,    Pulmonary exam normal breath sounds clear to auscultation       Cardiovascular negative cardio ROS Normal cardiovascular exam Rhythm:Regular Rate:Normal     Neuro/Psych negative neurological ROS  negative psych ROS   GI/Hepatic Neg liver ROS, GERD  Medicated and Controlled,  Endo/Other  negative endocrine ROS  Renal/GU negative Renal ROS  negative genitourinary   Musculoskeletal negative musculoskeletal ROS (+)   Abdominal   Peds negative pediatric ROS (+)  Hematology negative hematology ROS (+)   Anesthesia Other Findings   Reproductive/Obstetrics negative OB ROS                             Anesthesia Physical Anesthesia Plan  ASA: II  Anesthesia Plan: General   Post-op Pain Management:    Induction: Intravenous  Airway Management Planned: Oral ETT  Additional Equipment:   Intra-op Plan:   Post-operative Plan: Extubation in OR  Informed Consent: I have reviewed the patients History and Physical, chart, labs and discussed the procedure including the risks, benefits and alternatives for the proposed anesthesia with the patient or authorized representative who has indicated his/her understanding and acceptance.   Dental advisory given  Plan Discussed with: CRNA  Anesthesia Plan Comments:         Anesthesia Quick Evaluation

## 2016-05-27 DIAGNOSIS — M7061 Trochanteric bursitis, right hip: Secondary | ICD-10-CM | POA: Diagnosis not present

## 2016-05-27 LAB — CBC
HCT: 36.4 % (ref 36.0–46.0)
HEMOGLOBIN: 11.4 g/dL — AB (ref 12.0–15.0)
MCH: 28.1 pg (ref 26.0–34.0)
MCHC: 31.3 g/dL (ref 30.0–36.0)
MCV: 89.7 fL (ref 78.0–100.0)
PLATELETS: 202 10*3/uL (ref 150–400)
RBC: 4.06 MIL/uL (ref 3.87–5.11)
RDW: 13.3 % (ref 11.5–15.5)
WBC: 9.4 10*3/uL (ref 4.0–10.5)

## 2016-05-27 LAB — BASIC METABOLIC PANEL
ANION GAP: 6 (ref 5–15)
BUN: 7 mg/dL (ref 6–20)
CHLORIDE: 110 mmol/L (ref 101–111)
CO2: 24 mmol/L (ref 22–32)
Calcium: 8.3 mg/dL — ABNORMAL LOW (ref 8.9–10.3)
Creatinine, Ser: 0.73 mg/dL (ref 0.44–1.00)
Glucose, Bld: 178 mg/dL — ABNORMAL HIGH (ref 65–99)
POTASSIUM: 4.1 mmol/L (ref 3.5–5.1)
SODIUM: 140 mmol/L (ref 135–145)

## 2016-05-27 MED ORDER — OXYCODONE HCL 5 MG PO TABS: 5.0000 mg | ORAL_TABLET | ORAL | 0 refills | Status: DC | PRN

## 2016-05-27 MED ORDER — POLYETHYLENE GLYCOL 3350 17 G PO PACK: 17.0000 g | PACK | Freq: Two times a day (BID) | ORAL | 0 refills | Status: DC

## 2016-05-27 MED ORDER — ASPIRIN 81 MG PO CHEW: 81.0000 mg | CHEWABLE_TABLET | Freq: Two times a day (BID) | ORAL | 0 refills | Status: DC

## 2016-05-27 MED ORDER — FERROUS SULFATE 325 (65 FE) MG PO TABS: 325.0000 mg | ORAL_TABLET | Freq: Three times a day (TID) | ORAL | 3 refills | Status: DC

## 2016-05-27 MED ORDER — DOCUSATE SODIUM 100 MG PO CAPS: 100.0000 mg | ORAL_CAPSULE | Freq: Two times a day (BID) | ORAL | 0 refills | Status: DC

## 2016-05-27 NOTE — Care Management Note (Signed)
Case Management Note  Patient Details  Name: Shannon Conner MRN: [id number] Date of Birth: 1950/11/28  Subjective/Objective:                  OPEN BURSECTOMY  AND ILEOTIBIAL BAND RESCESSION (Right)  Action/Plan: Discharge planning Expected Discharge Date:  05/27/16               Expected Discharge Plan:  [place]  In-House Referral:     Discharge planning Services  CM Consult  Post Acute Care Choice:  Home Health Choice offered to:  Patient  DME Arranged:  3-N-1, Walker rolling DME Agency:  [place]:  PT [place] Agency:  [facility] ([facility])  Status of Service:  Completed, signed off  If discussed at [employer], dates discussed:    Additional Comments: Cm met with pt to offer choice of home health agency.  Pt chooses [facility].  Referral called to [facility] rep, Tim for [place].  Cm notified [facility] DME rep, to please deliver the rolling walker and 3n1 to room prior to discharge.  NO other CM needs were communicated. Dellie Catholic, RN 05/27/2016, 12:01 PM

## 2016-05-27 NOTE — Evaluation (Addendum)
Occupational Therapy Evaluation Patient Details Name: Shannon Conner MRN: [id number] DOB: Mar 15, 1951 Today's Date: 05/27/2016    History of Present Illness 65 yo female adm with tight R IT band and bursitis; s/p OPEN BURSECTOMY AND ILIOTIBIAL BAND RESCESSION R LE   Clinical Impression   Pt was admitted for the above. All education was completed.  Will provide typed guidelines for following precautions during bed mobility and tub transfers. No further OT is needed at this time    Follow Up Recommendations  No OT follow up;Supervision/Assistance - 24 hour    Equipment Recommendations  3 in 1 bedside comode    Recommendations for Other Services       Precautions / Restrictions Precautions Precautions: Fall Precaution Comments: NO ACTIVE ABDUCTION R HIP; reviewed with pt with regard to functional mobility Restrictions Weight Bearing Restrictions: No Other Position/Activity Restrictions: WBAT      Mobility Bed Mobility BY PT Overal bed mobility: Needs Assistance Bed Mobility: Supine to Sit     Supine to sit: Supervision     General bed mobility comments: cues to avoid abduction  Transfers Overall transfer level: Needs assistance Equipment used: Rolling walker (2 wheeled) Transfers: Sit to/from Stand Sit to Stand: Min guard;Supervision         General transfer comment: cues for hand placement and overall safety    Balance Overall balance assessment: Needs assistance           Standing balance-Leahy Scale: Fair                              ADL Overall ADL's : Needs assistance/impaired             Lower Body Bathing: Moderate assistance;Sit to/from stand       Lower Body Dressing: Moderate assistance;Sit to/from stand                 General ADL Comments: pt had just used bathroom.  Reviewed no active abduction with her in context of tub transfers and bed mobilty.  Educated on leg lifter vs sheet, crossing other leg under R  ankle, belt or someone to support and assist her.  Needs reinforcement.  Pt came back to sitting on edge of tub to get in:  recommended several alternative methods including sidestepping with LLE leading (both ways), using 3:1 against long side of tub and sitting back on it, supporting feet outside of tub with inexpensive shower curtain liner cut to keep water in. Pt has a hand held shower.  She has a Sports administrator, and educated on ADL uses for this     Advice worker      Pertinent Vitals/Pain Pain Assessment: 0-10 Pain Score: 4  Pain Location: R ankle, hurts more than hip Pain Descriptors / Indicators: Discomfort Pain Intervention(s): Limited activity within patient's tolerance;Monitored during session;Premedicated before session;Repositioned     Hand Dominance     Extremity/Trunk Assessment Upper Extremity Assessment Upper Extremity Assessment: Overall WFL for tasks assessed (arthritic changes in hand)          Communication Communication Communication: No difficulties   Cognition Arousal/Alertness: Awake/alert Behavior During Therapy: WFL for tasks assessed/performed Overall Cognitive Status: Within Functional Limits for tasks assessed                     General Comments       Exercises  Shoulder Instructions      Home Living Family/patient expects to be discharged to:: Private residence Living Arrangements: Spouse/significant other Available Help at Discharge: Family Type of Home: House Home Access: Stairs to enter Secretary/administrator of Steps: 1 small step   Home Layout: One level     Bathroom Shower/Tub: Chief Strategy Officer: Standard     Home Equipment: Wheelchair - manual   Additional Comments: husband will be with her at 88; grandson's girlfriend will be with her during day until 89      Prior Functioning/Environment Level of Independence: Independent                 OT Problem List:      OT Treatment/Interventions:      OT Goals(Current goals can be found in the care plan section) Acute Rehab OT Goals Patient Stated Goal: home today, less pain  OT Goal Formulation: All assessment and education complete, DC therapy  OT Frequency:     Barriers to D/C:            Co-evaluation              End of Session    Activity Tolerance: Patient tolerated treatment well Patient left: in chair;with call bell/phone within reach;with chair alarm set   Time: 1131-1152 OT Time Calculation (min): 21 min Charges:  OT General Charges $OT Visit: 1 Procedure OT Evaluation $OT Eval Low Complexity: 1 Procedure G-Codes: OT G-codes **NOT FOR INPATIENT CLASS** Functional Assessment Tool Used: clinical observation and judgment Functional Limitation: Self care Self Care Current Status (K4401): At least 40 percent but less than 60 percent impaired, limited or restricted Self Care Goal Status (U2725): At least 40 percent but less than 60 percent impaired, limited or restricted Self Care Discharge Status [phone] At least 40 percent but less than 60 percent impaired, limited or restricted  Kwaku Mostafa 05/27/2016, 12:31 PM Marica Otter, OTR/L [phone] 05/27/2016

## 2016-05-27 NOTE — Op Note (Signed)
NAMESHERRIL, SHIPMAN NO.:  [id number]  MEDICAL RECORD NO.:  [id number]  LOCATION:  PERIO                        FACILITY:  [facility]  PHYSICIAN:  Madlyn Frankel. Charlann Boxer, M.D.  DATE OF BIRTH:  August 20, 1950  DATE OF PROCEDURE:  05/26/2016 DATE OF DISCHARGE:                              OPERATIVE REPORT   PREOPERATIVE DIAGNOSIS:  Chronic right lateral hip pain consistent with bursitis with questionable gluteal tendon tear at its insertion.  POSTOPERATIVE DIAGNOSES/FINDINGS: 1. Tight iliotibial band and gluteal fascia overlying the lateral     trochanter. 2. Chronic right hip trochanteric bursitis. 3. Findings included intact gluteal insertion at the anterior aspect     of the greater trochanter.  PROCEDURES: 1. Open bursectomy, right hip. 2. Iliotibial band recession over the trochanter. 3. Removal of exostotic bumps off the lateral trochanter.  SURGEON:  Madlyn Frankel. Charlann Boxer, M.D.  ASSISTANT:  Jaquelyn Bitter. Chabon, PA-C.  Note that Mr. Chabon was present for the entirety of the case from preoperative position, perioperative management of the operative extremity, general facilitation of the case and primary wound closure.  ANESTHESIA:  General.  SPECIMENS:  None.  COMPLICATIONS:  None.  DRAINS:  None.  BLOOD LOSS:  Minimal.  INDICATIONS FOR PROCEDURE:  Ms. Mazurek is a pleasant 65 year old female with a significant history of cochlear implants.  She had been followed in the office for chronic lateral hip pain, which she had undergone multiple efforts at trying to treat this conservatively with physical therapy, medications and cortisone injections.  Given the persistence and recurrence of her symptoms, we were unable to perform a 3D imaging to evaluate for any gluteal tendon issues as a possibility in her age group.  She wished at this point to proceed with surgical exploration of lateral hip understanding that the risk of incision and scarring could  produce some persistent agitation of the lateral side of her hip versus the potential for benefit of pain relief.  Minimal risk of infection, DVT expected and reviewed.  Consent was obtained.  PROCEDURE IN DETAIL:  The patient was brought to the operative theater. Once adequate anesthesia, preoperative antibiotics, Ancef administered, she was positioned in the left lateral decubitus position with the right side up.  The right lower extremity was then prepped and draped in sterile fashion.  Time-out was performed identifying the patient, planned procedure and extremity.  A lateral based incision was made over the greater trochanter from around the area of the vastus lateralis extending about 5 cm from the top of the greater trochanter.  Soft tissue dissection was carried down to the gluteal fascia and iliotibial band.  This tissue appeared to be very healthy.  I then incised it and found that it was very taut.  As I released it, it became much more relaxed.  Underlying this, it was barely evident that the gluteus medius was intact and healthy appearing with good contractility at its insertion over the anterior aspect of the trochanter.  At this point, the iliotibial band was elevated and the bursa tissue around the lateral hip was excised.  I was able to palpate a couple of bony prominences on the lateral aspect of the trochanter and  posterior, which I excised.  Once this was done, I did not feel there was any necessary repair of the gluteus tendon as it was healthy and intact over the anterior aspect of the trochanter.  We irrigated the hip.  I then reapproximated the proximal aspect of the tensor fascia.  I did approach the lateral aspect of the hip and then distal to this.  I then made some small transverse incisions into this confluence of fascia fibers over the lateral aspect of her hip providing a significant recessed portion.  This was left open.  At this point, the remainder  of the wound was closed with 2-0 Vicryl and running 4-0 Monocryl.  The hip was then cleaned, dried, and dressed sterilely using surgical glue and Aquacel dressing.  She was then brought to the recovery room in stable condition tolerating the procedure well.  We will keep her overnight for pain control and physical therapy for weightbearing as tolerated, use of a walker until she seen back in followup in the office.  We will initiate physical therapy probably after 2-3 weeks to continue to work on stretching her iliotibial band and gluteal fascia, and work on strengthening.     Madlyn Frankel Charlann Boxer, M.D.     MDO/MEDQ  D:  05/26/2016  T:  05/27/2016  Job:  [id number]

## 2016-05-27 NOTE — Evaluation (Addendum)
Physical Therapy Evaluation Patient Details Name: Shannon Conner MRN: [id number] DOB: May 20, 1951 Today's Date: 05/27/2016   History of Present Illness  65 yo female adm with tight R IT band and bursitis; s/p OPEN BURSECTOMY AND ILIOTIBIAL BAND RESCESSION R LE  Clinical Impression  Patient evaluated by Physical Therapy with no further acute PT needs identified. All education has been completed and the patient has no further questions.  See below for any follow-up Physical Therapy or equipment needs. PT is signing off. Thank you for this referral.     Follow Up Recommendations HHPT    Equipment Recommendations  Rolling walker with 5" wheels;3in1 (PT)    Recommendations for Other Services       Precautions / Restrictions Precautions Precautions: Fall Precaution Comments: NO ACTIVE ABDUCTION R HIP; reviewed with pt with regard to functional mobility Restrictions Weight Bearing Restrictions: No Other Position/Activity Restrictions: WBAT      Mobility  Bed Mobility Overal bed mobility: Needs Assistance Bed Mobility: Supine to Sit     Supine to sit: Supervision     General bed mobility comments: cues to avoid abduction  Transfers Overall transfer level: Needs assistance Equipment used: Rolling walker (2 wheeled) Transfers: Sit to/from Stand Sit to Stand: Supervision;Min guard         General transfer comment: cues for hand placement and overall safety  Ambulation/Gait Ambulation/Gait assistance: Min guard;Supervision Ambulation Distance (Feet): 60 Feet Assistive device: Rolling walker (2 wheeled) Gait Pattern/deviations: Step-through pattern;Decreased stride length;Decreased weight shift to right     General Gait Details: cues for sequence, RW position and gait prgoression  Stairs            Wheelchair Mobility    Modified Rankin (Stroke Patients Only)       Balance Overall balance assessment: Needs assistance           Standing  balance-Leahy Scale: Fair                               Pertinent Vitals/Pain Pain Assessment: 0-10 Pain Score: 2  Pain Location: right hip Pain Descriptors / Indicators: Sore Pain Intervention(s): Limited activity within patient's tolerance;Monitored during session;Premedicated before session;Repositioned;Ice applied    Home Living Family/patient expects to be discharged to:: Private residence Living Arrangements: Spouse/significant other   Type of Home: House Home Access: Stairs to enter   Secretary/administrator of Steps: 1 small step Home Layout: One level Home Equipment: Wheelchair - manual      Prior Function Level of Independence: Independent               Hand Dominance        Extremity/Trunk Assessment   Upper Extremity Assessment: Defer to OT evaluation           Lower Extremity Assessment: RLE deficits/detail RLE Deficits / Details: ankle and knee WFL; HIP ABD not tested d/t precautions, otherwise grossly WFL       Communication   Communication: No difficulties  Cognition Arousal/Alertness: Awake/alert Behavior During Therapy: WFL for tasks assessed/performed Overall Cognitive Status: Within Functional Limits for tasks assessed                      General Comments      Exercises General Exercises - Lower Extremity Ankle Circles/Pumps: AROM;Both;10 reps Quad Sets: Both;10 reps;AROM   Assessment/Plan    PT Assessment Patent does not need any further PT services  PT  Problem List            PT Treatment Interventions      PT Goals (Current goals can be found in the Care Plan section)  Acute Rehab PT Goals Patient Stated Goal: home today, less pain  PT Goal Formulation: All assessment and education complete, DC therapy    Frequency     Barriers to discharge        Co-evaluation               End of Session   Activity Tolerance: Patient tolerated treatment well Patient left: in chair;with call  bell/phone within reach Nurse Communication: Mobility status    Functional Assessment Tool Used: clinical judgement Functional Limitation: Mobility: Walking and moving around Mobility: Walking and Moving Around Current Status (O9629): At least 1 percent but less than 20 percent impaired, limited or restricted Mobility: Walking and Moving Around Goal Status [phone]): At least 1 percent but less than 20 percent impaired, limited or restricted Mobility: Walking and Moving Around Discharge Status [phone]): At least 1 percent but less than 20 percent impaired, limited or restricted    Time: 0939-1000 PT Time Calculation (min) (ACUTE ONLY): 21 min   Charges:   PT Evaluation $PT Eval Low Complexity: 1 Procedure     PT G Codes:   PT G-Codes **NOT FOR INPATIENT CLASS** Functional Assessment Tool Used: clinical judgement Functional Limitation: Mobility: Walking and moving around Mobility: Walking and Moving Around Current Status (N0272): At least 1 percent but less than 20 percent impaired, limited or restricted Mobility: Walking and Moving Around Goal Status [phone]): At least 1 percent but less than 20 percent impaired, limited or restricted Mobility: Walking and Moving Around Discharge Status [phone]): At least 1 percent but less than 20 percent impaired, limited or restricted    [facility] 05/27/2016, 11:44 AM

## 2016-05-27 NOTE — Progress Notes (Signed)
     Subjective: 1 Day Post-Op Procedure(s) (LRB): OPEN BURSECTOMY  AND ILEOTIBIAL BAND RESCESSION (Right)   Patient reports pain as mild, pain controlled. No events throughout the night. Dr. Charlann Boxer explained the procedure. Ready to be discharged home.   Objective:   VITALS:   Vitals:   05/27/16 0300 05/27/16 0639  BP: 101/61 (!) 104/57  Pulse: 83 83  Resp: 15 15  Temp: 98.3 F (36.8 C) 98.2 F (36.8 C)    Dorsiflexion/Plantar flexion intact Incision: dressing C/D/I No cellulitis present Compartment soft  LABS  Recent Labs  05/27/16 0352  HGB 11.4*  HCT 36.4  WBC 9.4  PLT 202     Recent Labs  05/27/16 0352  NA 140  K 4.1  BUN 7  CREATININE 0.73  GLUCOSE 178*     Assessment/Plan: 1 Day Post-Op Procedure(s) (LRB): OPEN BURSECTOMY  AND ILEOTIBIAL BAND RESCESSION (Right) Up with therapy Discharge home Follow up in 2 weeks at [facility]. Follow up with OLIN,Arnette Driggs D in 2 weeks.  Contact information:  [facility] [address] [place] [address] [phone]    Overweight (BMI 25-29.9) Estimated body mass index is 26.95 kg/m as calculated from the following:   Height as of this encounter: 5\' 6"  (1.676 m).   Weight as of this encounter: 75.8 kg (167 lb). Patient also counseled that weight may inhibit the healing process Patient counseled that losing weight will help with future health issues         . Brina Umeda   PAC  05/27/2016, 7:37 AM

## 2016-05-30 NOTE — Discharge Summary (Signed)
Physician Discharge Summary  Patient ID: Shannon Conner MRN: [id number] DOB/AGE: [birth year] 65 y.o.  Admit date: 05/26/2016 Discharge date: 05/27/2016   Procedures:  Procedure(s) (LRB): OPEN BURSECTOMY  AND ILEOTIBIAL BAND RESCESSION (Right)  Attending Physician:  Dr. Durene Romans   Admission Diagnoses:   Right hip troch bursitis and gluteal tendon tear  Discharge Diagnoses:  Principal Problem:   Right gluteus tear  Past Medical History:  Diagnosis Date  . Arthritis    RA  . Chronic right-sided low back pain   . GERD (gastroesophageal reflux disease)   . History of seasonal allergies   . Hypothyroidism   . Sleep apnea    "mild"- no tx  . Tuberculosis     HPI:    Pt is a 65 y.o. female complaining of right hip pain for 10-12 years. Pain had continually increased since the beginning. Imaging studies reveal  right hip gluteal tendon tear. Pt has tried various conservative treatments which have failed to alleviate their symptoms, including NSAIDs, cortisone injections and activity modification. Various options are discussed with the patient. Risks, benefits and expectations were discussed with the patient. Patient understand the risks, benefits and expectations and wishes to proceed with surgery.  PCP: Lucila Maine, MD   Discharged Condition: good  Hospital Course:  Patient underwent the above stated procedure on 05/26/2016. Patient tolerated the procedure well and brought to the recovery room in good condition and subsequently to the floor.  POD #1 BP: 104/57 ; Pulse: 83 ; Temp: 98.2 F (36.8 C) ; Resp: 15 Patient reports pain as mild, pain controlled. No events throughout the night. Dr. Charlann Boxer explained the procedure. Ready to be discharged home.  Dorsiflexion/plantar flexion intact, incision: dressing C/D/I, no cellulitis present and compartment soft.   LABS  Basename    HGB     11.4  HCT     36.4    Discharge Exam: General appearance: alert, cooperative and no  distress Extremities: Homans sign is negative, no sign of DVT, no edema, redness or tenderness in the calves or thighs and no ulcers, gangrene or trophic changes  Disposition: Home with follow up in 2 weeks   Follow-up Information    Shelda Pal, MD. Schedule an appointment as soon as possible for a visit in 2 week(s).   Specialty:  Orthopedic Surgery Contact information: [address] [place] [address] [phone]        [facility] Follow up.   Why:  rolling walker and 3n1 Contact information: [address] [place] [address] [phone]           Discharge Instructions    Call MD / Call 911    Complete by:  As directed    If you experience chest pain or shortness of breath, CALL 911 and be transported to the hospital emergency room.  If you develope a fever above 101 F, pus (white drainage) or increased drainage or redness at the wound, or calf pain, call your surgeon's office.   Constipation Prevention    Complete by:  As directed    Drink plenty of fluids.  Prune juice may be helpful.  You may use a stool softener, such as Colace (over the counter) 100 mg twice a day.  Use MiraLax (over the counter) for constipation as needed.   Diet - low sodium heart healthy    Complete by:  As directed    Discharge instructions    Complete by:  As directed    Maintain surgical dressing until follow up in the clinic. If the edges start to pull up, may reinforce with tape. If the dressing is no longer working, may remove and cover with gauze and tape, but must keep the area dry and clean.  Follow up in 2 weeks at [facility]. Call with any questions or concerns.   Weight bearing as tolerated    Complete by:  As directed    Laterality:  right   Extremity:  Lower        Medication List    STOP taking these medications   naproxen sodium 220 MG tablet Commonly known as:  ANAPROX     TAKE these medications     amitriptyline 50 MG tablet Commonly known as:  ELAVIL Take 50 mg by mouth at bedtime.   aspirin 81 MG chewable tablet Chew 1 tablet (81 mg total) by mouth 2 (two) times daily. Take for 4 weeks.   B-12 COMPLIANCE INJECTION IJ Inject as directed every 30 (thirty) days.   CALCIUM 1200+D3 PO Take 1 tablet by mouth at bedtime.   docusate sodium 100 MG capsule Commonly known as:  COLACE Take 1 capsule (100 mg total) by mouth 2 (two) times daily.   ferrous sulfate 325 (65 FE) MG tablet Take 1 tablet (325 mg total) by mouth 3 (three) times daily after meals.   fluticasone 50 MCG/ACT nasal spray Commonly known as:  FLONASE Place 1-2 sprays into both nostrils daily as needed for allergies or rhinitis.   gabapentin 600 MG tablet Commonly known as:  NEURONTIN Take 600 mg by mouth at bedtime.   ICAPS AREDS 2 Caps Take 2 capsules by mouth 2 (two) times daily.   levothyroxine 88 MCG tablet Commonly known as:  SYNTHROID, LEVOTHROID Take 88 mcg by mouth daily before breakfast.   loratadine 10 MG tablet Commonly known as:  CLARITIN Take 10 mg by mouth daily as needed for allergies.   omeprazole 20 MG capsule Commonly known as:  PRILOSEC Take 20 mg by mouth 2 (two) times daily.   ORENCIA 125 MG/ML Sosy Generic drug:  Abatacept Inject 125 mg into the skin once a week.   oxyCODONE 5 MG immediate release tablet Commonly known as:  Oxy IR/ROXICODONE Take 1-2 tablets (5-10 mg total) by mouth every 4 (four) hours as needed for severe pain.   polycarbophil 625 MG tablet Commonly known as:  FIBERCON Take 625 mg by mouth 2 (two) times daily.   polyethylene glycol packet Commonly known as:  MIRALAX / GLYCOLAX Take 17 g by mouth 2 (two) times daily.   risedronate 150 MG tablet Commonly known as:  ACTONEL Take 150 mg by mouth every 30 (thirty) days. On the 17th of each month   rosuvastatin 20 MG tablet Commonly known as:  CRESTOR Take 20 mg by mouth every evening.   sodium  chloride 0.65 % Soln nasal spray Commonly known as:  OCEAN Place 1 spray into both nostrils as needed for congestion.   SYSTANE ULTRA OP Apply 1 drop to eye daily as needed (dry eyes).   tiZANidine 4 MG tablet Commonly known as:  ZANAFLEX Take 4 mg by mouth 3 (three) times daily as needed for muscle spasms.   Vitamin D3 2000 units Tabs Take 2,000 Units by mouth daily.        Signed: Anastasio Auerbach. Evens Meno   PA-C  05/30/2016, 1:00 PM

## 2018-05-07 DIAGNOSIS — M25551 Pain in right hip: Secondary | ICD-10-CM | POA: Insufficient documentation

## 2018-05-07 HISTORY — DX: Pain in right hip: M25.551

## 2019-09-27 DIAGNOSIS — R011 Cardiac murmur, unspecified: Secondary | ICD-10-CM

## 2020-05-29 DIAGNOSIS — M76891 Other specified enthesopathies of right lower limb, excluding foot: Secondary | ICD-10-CM | POA: Insufficient documentation

## 2020-05-29 HISTORY — DX: Other specified enthesopathies of right lower limb, excluding foot: M76.891

## 2020-06-26 ENCOUNTER — Other Ambulatory Visit: Payer: Self-pay | Admitting: Physical Medicine and Rehabilitation

## 2020-06-26 DIAGNOSIS — M5030 Other cervical disc degeneration, unspecified cervical region: Secondary | ICD-10-CM

## 2020-06-26 DIAGNOSIS — M5136 Other intervertebral disc degeneration, lumbar region: Secondary | ICD-10-CM

## 2020-06-27 ENCOUNTER — Telehealth: Payer: Self-pay

## 2020-07-11 ENCOUNTER — Ambulatory Visit
Admission: RE | Admit: 2020-07-11 | Discharge: 2020-07-11 | Disposition: A | Discharge status: Home / Self Care | Payer: Medicare Other | Source: Ambulatory Visit | Attending: Physical Medicine and Rehabilitation | Admitting: Physical Medicine and Rehabilitation

## 2020-07-11 DIAGNOSIS — M5030 Other cervical disc degeneration, unspecified cervical region: Secondary | ICD-10-CM

## 2020-07-11 DIAGNOSIS — M5136 Other intervertebral disc degeneration, lumbar region: Secondary | ICD-10-CM

## 2020-07-11 DIAGNOSIS — M51369 Other intervertebral disc degeneration, lumbar region without mention of lumbar back pain or lower extremity pain: Secondary | ICD-10-CM

## 2020-07-11 MED ORDER — DIAZEPAM 5 MG PO TABS
5.0000 mg | ORAL_TABLET | Freq: Once | ORAL | Status: AC
  Administered 2020-07-11: 5 mg via ORAL

## 2020-07-11 MED ORDER — IOPAMIDOL (ISOVUE-M 300) INJECTION 61%
10.0000 mL | Freq: Once | INTRAMUSCULAR | Status: AC
  Administered 2020-07-11: 10 mL via INTRATHECAL

## 2020-07-11 NOTE — Discharge Instructions (Signed)
Myelogram Discharge Instructions  1. Go home and rest quietly for the next 24 hours.  It is important to lie flat for the next 24 hours.  Get up only to go to the restroom.  You may lie in the bed or on a couch on your back, your stomach, your left side or your right side.  You may have one pillow under your head.  You may have pillows between your knees while you are on your side or under your knees while you are on your back.  2. DO NOT drive today.  Recline the seat as far back as it will go, while still wearing your seat belt, on the way home.  3. You may get up to go to the bathroom as needed.  You may sit up for 10 minutes to eat.  You may resume your normal diet and medications unless otherwise indicated.  Drink lots of extra fluids today and tomorrow.  4. The incidence of headache, nausea, or vomiting is about 5% (one in 20 patients).  If you develop a headache, lie flat and drink plenty of fluids until the headache goes away.  Caffeinated beverages may be helpful.  If you develop severe nausea and vomiting or a headache that does not go away with flat bed rest, call [phone].  5. You may resume normal activities after your 24 hours of bed rest is over; however, do not exert yourself strongly or do any heavy lifting tomorrow. If when you get up you have a headache when standing, go back to bed and force fluids for another 24 hours.  6. Call your physician for a follow-up appointment.  The results of your myelogram will be sent directly to your physician by the following day.  7. If you have any questions or if complications develop after you arrive home, please call [phone].  Discharge instructions have been explained to the patient.  The patient, or the person responsible for the patient, fully understands these instructions   YOU MAY RESUME YOUR AMITRIPTYLINE AND TRAMADOL TOMORROW 07/12/20 AT 1:30 PM

## 2020-07-11 NOTE — Progress Notes (Signed)
Patient states she has been off Tramadol and Amitriptyline for at least the past two days.

## 2021-10-09 ENCOUNTER — Other Ambulatory Visit: Payer: Self-pay | Admitting: Orthopedic Surgery

## 2021-10-09 DIAGNOSIS — M7651 Patellar tendinitis, right knee: Secondary | ICD-10-CM

## 2022-08-23 IMAGING — CR DG MYELOGRAPHY LUMBAR INJ MULTI REGION
12 of 24 series · 12 of 24 positions shown · non-contrast
Comparison: none

CLINICAL DATA: Right neck pain extending the scapula. Low back
pain, worse on the right. Right lower extremity radiculopathy
extends to the top of the foot.
TECHNIQUE: Contiguous axial images were obtained through the Cervical and
Lumbar spine after the intrathecal infusion of infusion. Coronal and
sagittal reconstructions were obtained of the axial image sets.

[vasc adipose (1 of 8)]
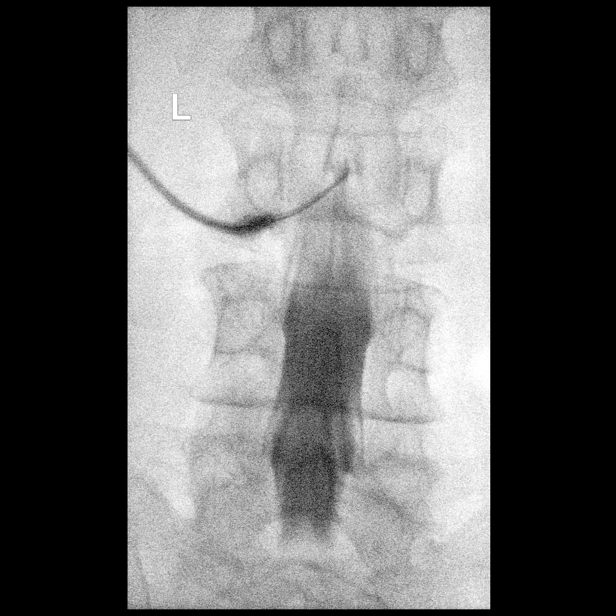

[w cervical spine flexion]
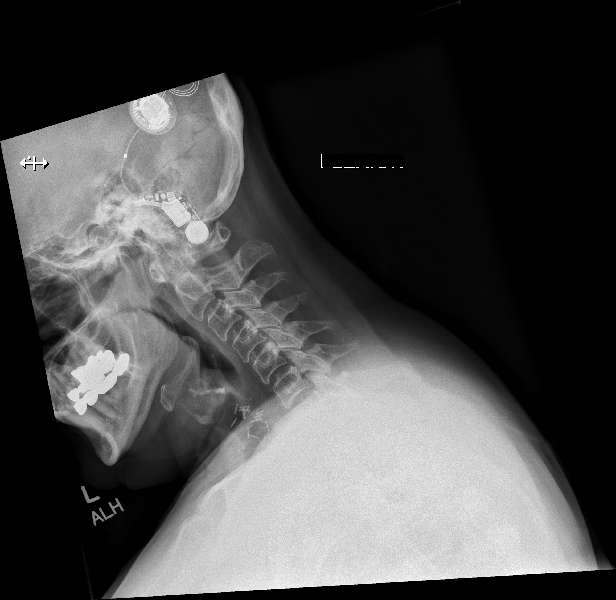

[w cervical spine extension]
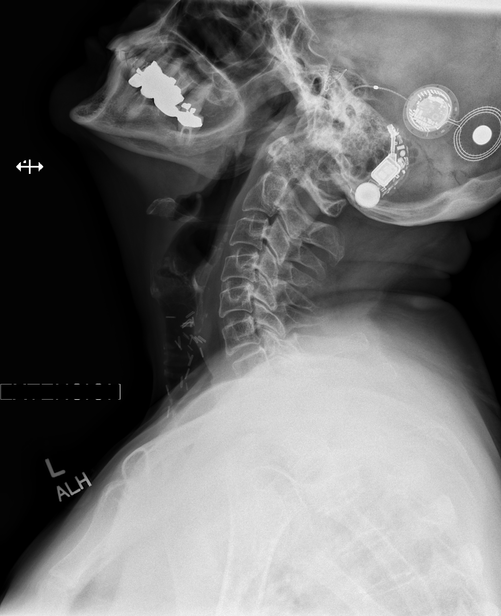

[w lumbar spine lat]
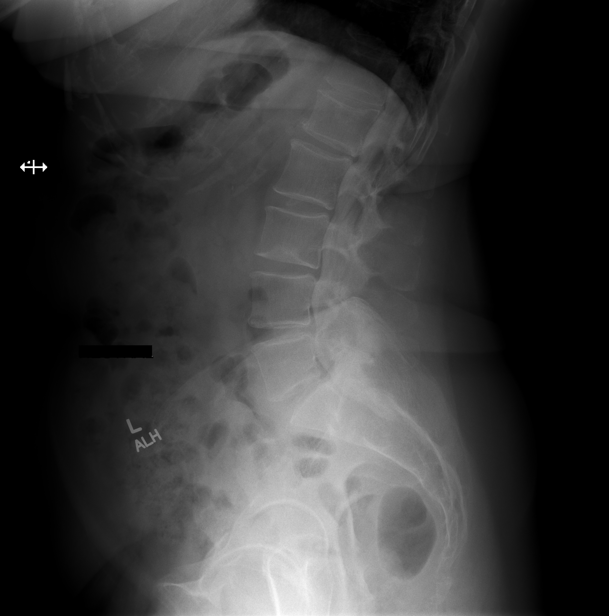

[vasc adipose (2 of 8)]
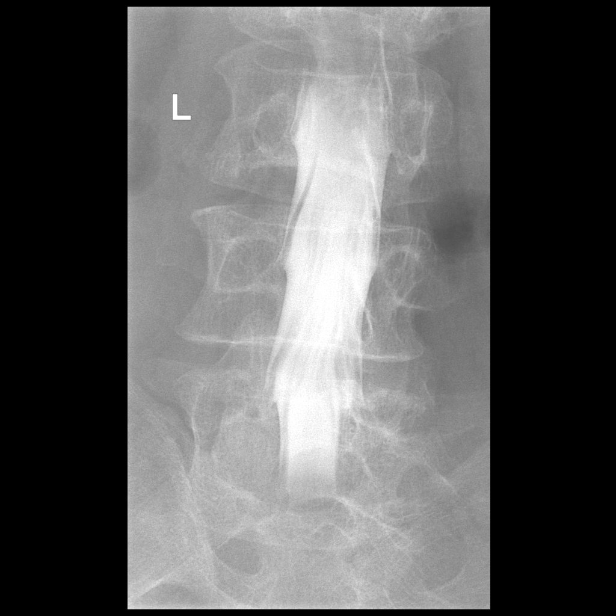

[w lumbar spine extension]
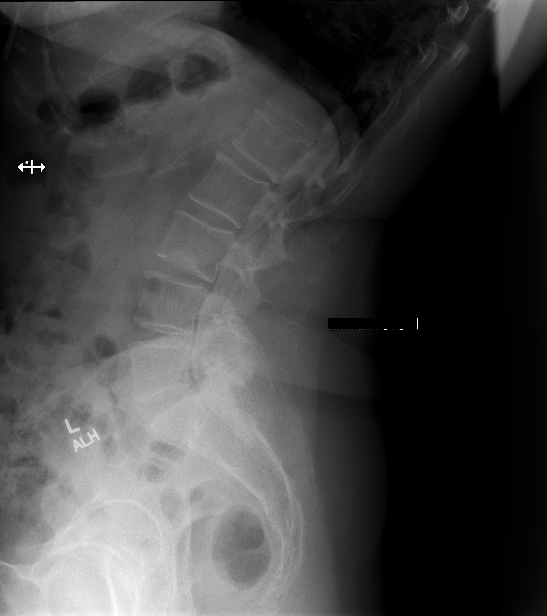

[vasc adipose (3 of 8)]
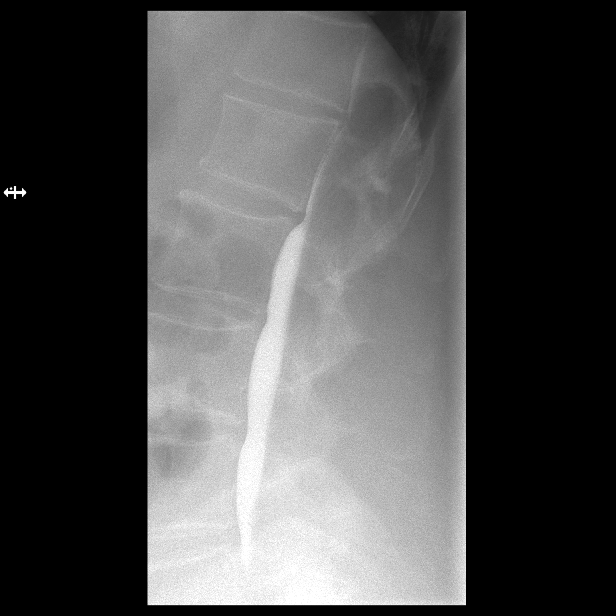

[vasc adipose (4 of 8)]
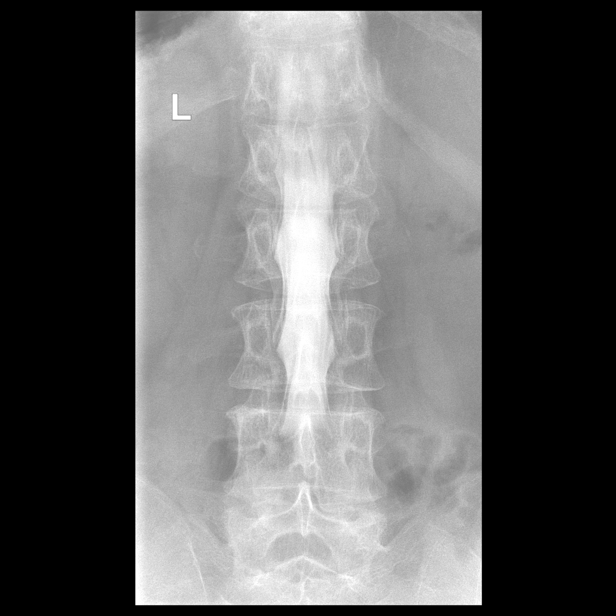

[vasc adipose (5 of 8)]
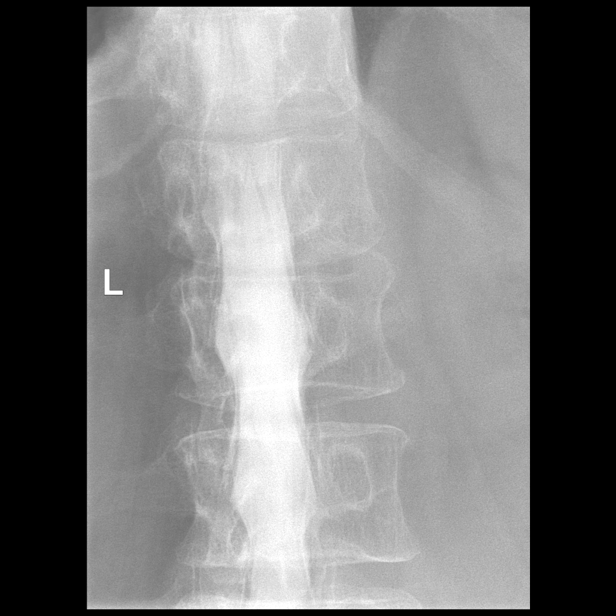

[vasc adipose (6 of 8)]
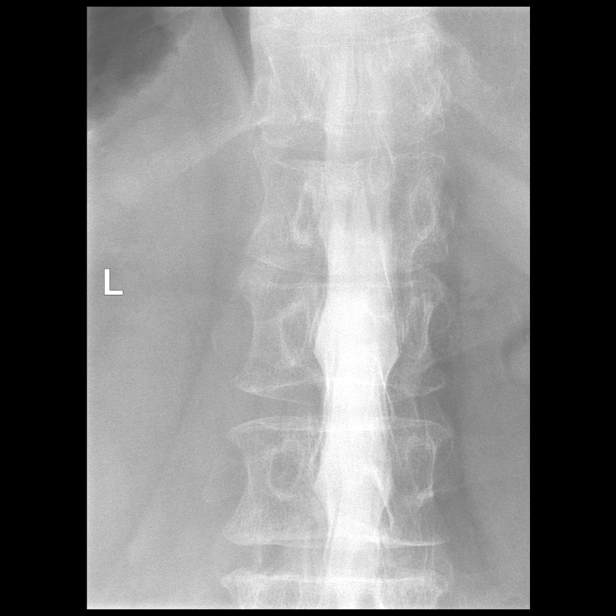

[vasc adipose (7 of 8)]
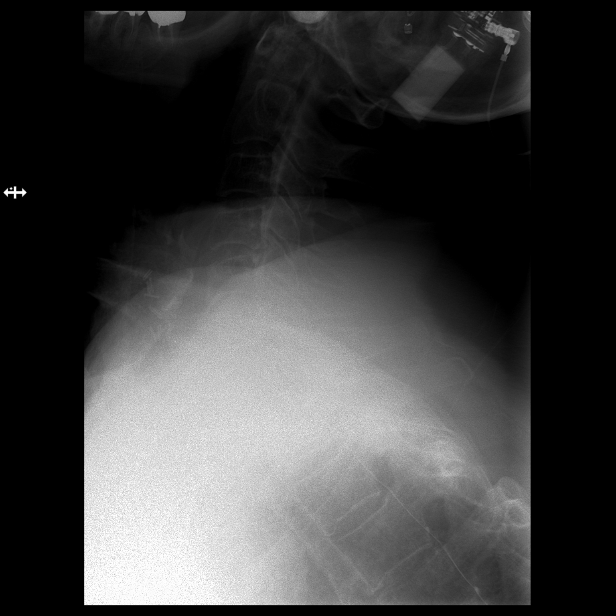

[vasc adipose (8 of 8)]
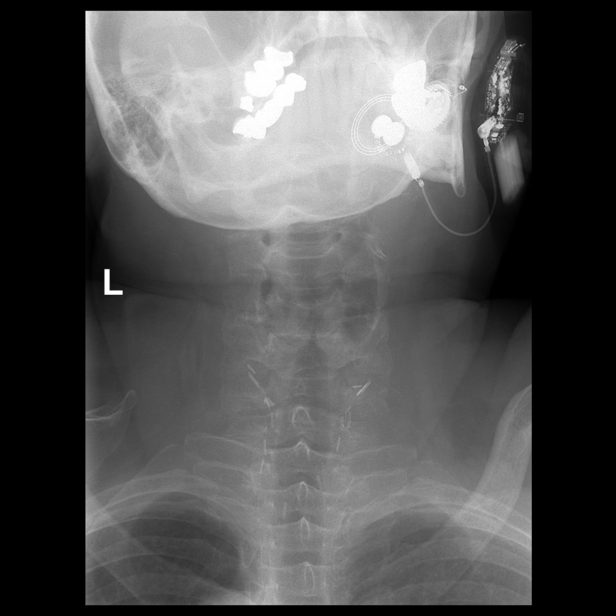

[12 of 24 positions shown; findings below may reference images not displayed]

FLUOROSCOPY TIME:  Radiation Exposure Index (as provided by the
fluoroscopic device): 746.43 uGy*m2

PROCEDURE:
LUMBAR PUNCTURE FOR CERVICAL AND LUMBAR MYELOGRAM

CERVICAL AND LUMBAR MYELOGRAM

CT CERVICAL MYELOGRAM

CT LUMBAR MYELOGRAM

After thorough discussion of risks and benefits of the procedure
including bleeding, infection, injury to nerves, blood vessels,
adjacent structures as well as headache and CSF leak, written and
oral informed consent was obtained. Consent was obtained by Dr.
Ferienhaus Erxleben.

Patient was positioned prone on the fluoroscopy table. Local
anesthesia was provided with 1% lidocaine without epinephrine after
prepped and draped in the usual sterile fashion. Puncture was
performed at LEVEL using a L2-3 22-gauge spinal needle via left
paramedian approach. Using a single pass through the dura, the
needle was placed within the thecal sac, with return of clear CSF.
10 mL of Isovue J-[id number] was injected into the thecal sac, with normal
opacification of the nerve roots and cauda equina consistent with
free flow within the subarachnoid space. The patient was then moved
to the trendelenburg position and contrast flowed into the Cervical
spine region.

I personally performed the lumbar puncture and administered the
intrathecal contrast. I also personally supervised acquisition of
the myelogram images.
FINDINGS: CERVICAL AND LUMBAR MYELOGRAM FINDINGS:

Five non rib-bearing lumbar type vertebral bodies are present.
Slight retrolisthesis present at L1-2. Remote L1 fracture is
present. There is uncovering of broad-based disc protrusion at L1-2
with subarticular narrowing bilaterally. Central canal narrowing is
present at L5-S1. There is no abnormal motion with flexion or
extension in the lumbar spine.

No significant listhesis is present cervical spine. Uncovertebral
spurring is greatest at C5-6 bilaterally and C4-5 on the left. Lung
apices are clear.

Slight retrolisthesis at C3-4 and C4-5 is reduced in flexion.

CT CERVICAL MYELOGRAM FINDINGS:

Cervical spine is imaged from the skull base through the
cervicothoracic junction. Spinal cord morphology is normal.
Craniocervical junction is within normal limits. Visualized
intracranial contents are normal.

Soft tissues the neck demonstrate thyroidectomy. Minimal
atherosclerotic changes are noted at the carotid bifurcations
bilaterally. No significant stenosis is evident.

C1-2: Negative.

C2-3: Negative.

C3-4: Mild disc osteophyte complex present. Mild facet hypertrophy
is present bilaterally. No significant stenosis is present.

C4-5: Broad-based disc osteophyte complex present. Uncovertebral
spurring and soft disc is asymmetric to the left. Moderate left and
mild right foraminal narrowing is present. Partial effacement of the
ventral CSF is noted.

C5-6: Mild uncovertebral spurring is present bilaterally. No
significant stenosis is present.

C6-7: Negative.

C7-T1: Negative.

CT LUMBAR MYELOGRAM FINDINGS:

Five non rib-bearing lumbar type vertebral bodies are present.
Superior endplate fracture/Schmorl's node at L1 is chronic. Inferior
endplate Schmorl's node noted at L3. Slight retrolisthesis present
at L1-2. No other significant listhesis is present. Lumbar lordosis
is preserved.

Atherosclerotic calcifications are present in the aorta and branch
vessels without aneurysm. No solid organ lesions are present. No
significant adenopathy is present.

T12-L1: Mild facet hypertrophy is present bilaterally. No
significant stenosis is present.

L1-2: There is uncovering of a broad-based disc protrusion. Mild
facet hypertrophy is noted bilaterally. Mild bilateral foraminal
narrowing is worse on the right.

L1-2: Mild facet hypertrophy and ligamentum flavum thickening is
present. No significant disc protrusion or stenosis is present.

L3-4: Broad-based disc protrusion is asymmetric to the left. Mild
facet hypertrophy and ligamentum flavum thickening is present
bilaterally. Mild bilateral foraminal narrowing is present. The
central canal is patent.

L4-5: A broad-based disc protrusion is present. Asymmetric
ligamentum flavum thickening is noted on the right. Mild
subarticular narrowing is evident bilaterally. Disc extends into
both neural foramina with mild foraminal narrowing, right greater
than left.

L5-S1: Mild broad-based disc protrusion is present. Facet
hypertrophy and ligamentum flavum thickening is noted bilaterally.
Mild subarticular narrowing is worse on the right. Disc extends into
both neural foramina. Mild right foraminal narrowing is present.
IMPRESSION: 1. Moderate left and mild right foraminal stenosis at C4-5.
2. Mild facet hypertrophy bilaterally at C3-4 without significant
stenosis.
3. Mild uncovertebral spurring bilaterally at C5-6 without
significant stenosis.
4. Mild subarticular and foraminal narrowing bilaterally at L3-4 and
L4-5 secondary to a broad-based disc protrusion, facet hypertrophy,
and ligamentum flavum thickening. This could impact the L3, L4, or
L5 nerve roots. Right L5 nerve root appears to be at highest risk at
the L4-5 level.
5. Mild right foraminal narrowing at L5-S1.
6. Mild bilateral foraminal narrowing at L1-2 is worse on the right.
7. Aortic Atherosclerosis ([id number]).

## 2022-12-11 ENCOUNTER — Telehealth: Payer: Self-pay

## 2022-12-11 NOTE — Patient Outreach (Signed)
  Care Coordination   12/11/2022 Name: Shannon Conner MRN: [id number] DOB: Jun 12, 1951   Care Coordination Outreach Attempts:  An unsuccessful telephone outreach was attempted today to offer the patient information about available care coordination services.  Follow Up Plan:  Additional outreach attempts will be made to offer the patient care coordination information and services.   Encounter Outcome:  No Answer   Care Coordination Interventions:  No, not indicated    Rowe Pavy, RN, BSN, [facility] [employer] [phone]

## 2022-12-24 ENCOUNTER — Telehealth: Payer: Self-pay

## 2022-12-24 NOTE — Patient Outreach (Signed)
  Care Coordination   12/24/2022 Name: Shannon Conner MRN: [id number] DOB: 12/15/1950   Care Coordination Outreach Attempts:  A second unsuccessful outreach was attempted today to offer the patient with information about available care coordination services.  Follow Up Plan:  Additional outreach attempts will be made to offer the patient care coordination information and services.   Encounter Outcome:  No Answer   Care Coordination Interventions:  No, not indicated    Rowe Pavy, RN, BSN, [facility] [employer] [phone]

## 2022-12-25 ENCOUNTER — Telehealth: Payer: Self-pay

## 2022-12-25 NOTE — Patient Outreach (Signed)
  Care Coordination   12/25/2022 Name: Shannon Conner MRN: [id number] DOB: 24-Feb-1951   Care Coordination Outreach Attempts:  A third unsuccessful outreach was attempted today to offer the patient with information about available care coordination services.  Follow Up Plan:  No further outreach attempts will be made at this time. We have been unable to contact the patient to offer or enroll patient in care coordination services  Encounter Outcome:  No Answer   Care Coordination Interventions:  No, not indicated    Rowe Pavy, RN, BSN, [facility] Coordinator [phone]

## 2023-04-02 ENCOUNTER — Other Ambulatory Visit: Payer: Self-pay | Admitting: Student

## 2023-04-02 DIAGNOSIS — M25561 Pain in right knee: Secondary | ICD-10-CM

## 2023-04-11 NOTE — Progress Notes (Unsigned)
Cardiology Office Note:    Date:  04/13/2023   ID:  Shannon Conner, Shannon Conner 1951-04-16, MRN [id number]  PCP:  Street, Stephanie Coup, MD  Cardiologist:  Norman Herrlich, MD   Referring MD: [address] Stephanie Coup, *  ASSESSMENT:    1. Chest pain of uncertain etiology   2. Precordial pain   3. Rheumatoid arthritis, involving unspecified site, unspecified whether rheumatoid factor present (HCC)    PLAN:    In order of problems listed above:  His background history of symptoms called angina has had 2 episodes of chest discomfort probably best described as possible angina. Further evaluation with cardiac CTA in the interim to start coated aspirin 81 mg daily and Toprol-XL 25 mg/day Depending on her coronary calcium score and the presence or absence of CAD make a decision about lipid-lowering therapy lipid profile 08/27/2022 with a cholesterol of 284 triglycerides of 204.  Next appointment 6 weeks   Medication Adjustments/Labs and Tests Ordered: Current medicines are reviewed at length with the patient today.  Concerns regarding medicines are outlined above.  Orders Placed This Encounter  Procedures   CT CORONARY MORPH W/CTA COR W/SCORE W/CA W/CM &/OR WO/CM   Basic Metabolic Panel (BMET)   EKG 12-Lead   Meds ordered this encounter  Medications   metoprolol succinate (TOPROL XL) 25 MG 24 hr tablet    Sig: Take 1 tablet (25 mg total) by mouth daily.    Dispense:  90 tablet    Refill:  3   metoprolol tartrate (LOPRESSOR) 100 MG tablet    Sig: Take 1 tablet (100 mg total) by mouth once for 1 dose. Please take this medication 2 hours before CT.    Dispense:  1 tablet    Refill:  0     No chief complaint on file.   History of Present Illness:    Shannon Conner is a 72 y.o. female with a history of rheumatoid arthritis who is being seen today for the evaluation of chest pain at the request of Street, Stephanie Coup, *.  She was seen with her PCP 04/09/2019 for the urgent referral for  chest pain.  The symptoms appear to be typical angina with a notation she has a Levine sign to describe her chest pain.  Her history is noteworthy for dyslipidemia and rheumatoid arthritis never smoked.  She was on a statin Crestor 20 mg daily.  I independently reviewed her EKG from that date showing sinus rhythm abnormal R wave progression 1 V1-V2 late transition in the precordial leads.  She has a history in the 1990s be admitted to the hospital diagnosed as angina does not sound like she needs a stress perfusion study or cardiac cath and afterwards intermittently took nitroglycerin Recently she has had 2 episodes of discomfort associated with very stressful events including illness and her granddaughter And begins as burning through her shoulders then will radiate to the anterior chest and then into the left breast area.  She describes it as very severe lasted 5 or 10 minutes and resolved spontaneously unassociated with physical effort  She does not have exertional chest pain edema shortness of breath palpitation or syncope.  It was not pleuritic in nature. Past Medical History:  Diagnosis Date   Arthritis    RA   Chronic right-sided low back pain    GERD (gastroesophageal reflux disease)    History of seasonal allergies    Hypothyroidism    Sleep apnea    "mild"- no tx  Tuberculosis     Past Surgical History:  Procedure Laterality Date   BREAST SURGERY Bilateral    cyst removal one, "tumor removed from the other"   COCHLEAR IMPLANT Right    x 2   EXCISION/RELEASE BURSA HIP Right 05/26/2016   Procedure: OPEN BURSECTOMY  AND ILEOTIBIAL BAND RESCESSION;  Surgeon: Durene Romans, MD;  Location: [facility];  Service: Orthopedics;  Laterality: Right;   FRACTURE SURGERY     nose   NASAL SINUS SURGERY     x 4/states sever back pain following this most recent one- 9/17    Current Medications: Current Meds  Medication Sig   amitriptyline (ELAVIL) 50 MG tablet Take 50 mg by mouth at bedtime.    Ascorbic Acid (VITAMIN C WITH ROSE HIPS) 500 MG tablet Take 500 mg by mouth daily.   Calcium-Magnesium-Vitamin D (CALCIUM 1200+D3 PO) Take 1 tablet by mouth at bedtime.   Cholecalciferol (VITAMIN D3) 2000 units TABS Take 2,000 Units by mouth daily.   Cyanocobalamin (B-12 COMPLIANCE INJECTION IJ) Inject as directed every 30 (thirty) days.   denosumab (PROLIA) 60 MG/ML SOSY injection Inject 60 mg into the skin every 6 (six) months.   folic acid (FOLVITE) 1 MG tablet Take 1 mg by mouth daily.   levothyroxine (SYNTHROID, LEVOTHROID) 88 MCG tablet Take 88 mcg by mouth daily before breakfast.   loratadine (CLARITIN) 10 MG tablet Take 10 mg by mouth daily as needed for allergies.   methotrexate (RHEUMATREX) 2.5 MG tablet Take 2.5 mg by mouth once a week. Caution:Chemotherapy. Protect from light.   metoprolol succinate (TOPROL XL) 25 MG 24 hr tablet Take 1 tablet (25 mg total) by mouth daily.   metoprolol tartrate (LOPRESSOR) 100 MG tablet Take 1 tablet (100 mg total) by mouth once for 1 dose. Please take this medication 2 hours before CT.   Multiple Vitamins-Minerals (ICAPS AREDS 2) CAPS Take 2 capsules by mouth 2 (two) times daily.    naproxen sodium (ALEVE) 220 MG tablet Take 220 mg by mouth.   omeprazole (PRILOSEC) 20 MG capsule Take 20 mg by mouth 2 (two) times daily.   oxyCODONE (OXY IR/ROXICODONE) 5 MG immediate release tablet Take 1-2 tablets (5-10 mg total) by mouth every 4 (four) hours as needed for severe pain.   Polyethyl Glycol-Propyl Glycol (SYSTANE ULTRA OP) Apply 1 drop to eye daily as needed (dry eyes).   polyethylene glycol (MIRALAX / GLYCOLAX) packet Take 17 g by mouth 2 (two) times daily.   rosuvastatin (CRESTOR) 20 MG tablet Take 20 mg by mouth every evening.   sodium chloride (OCEAN) 0.65 % SOLN nasal spray Place 1 spray into both nostrils as needed for congestion.   zinc gluconate 50 MG tablet Take 50 mg by mouth daily.     Allergies:   Ibuprofen, Meclofenamate, Propoxyphene,  and Acetaminophen   Social History   Socioeconomic History   Marital status: Married    Spouse name: Not on file   Number of children: Not on file   Years of education: Not on file   Highest education level: Not on file  Occupational History   Not on file  Tobacco Use   Smoking status: Former   Smokeless tobacco: Never  Substance and Sexual Activity   Alcohol use: No   Drug use: No   Sexual activity: Not on file  Other Topics Concern   Not on file  Social History Narrative   Not on file   Social Determinants of Health   Financial  Resource Strain: Not on file  Food Insecurity: Not on file  Transportation Needs: Not on file  Physical Activity: Not on file  Stress: Not on file  Social Connections: Not on file     Family History: The patient's family history is not on file.  ROS:   ROS Please see the history of present illness.     All other systems reviewed and are negative.  EKGs/Labs/Other Studies Reviewed:    The following studies were reviewed today: EKG Interpretation Date/Time:  Monday April 13 2023 15:42:13 EDT Ventricular Rate:  84 PR Interval:  172 QRS Duration:  86 QT Interval:  364 QTC Calculation: 430 R Axis:   -36  Text Interpretation: Normal sinus rhythm Left axis deviation Poor R wave progression Late transition No previous ECGs available Confirmed by Norman Herrlich ([id number]) on 04/13/2023 3:46:28 PM    Physical Exam:    VS:  BP 124/60 (BP Location: Right Arm, Patient Position: Sitting, Cuff Size: Normal)   Pulse 84   Ht 5\' 5"  (1.651 m)   Wt 175 lb 6.4 oz (79.6 kg)   SpO2 95%   BMI 29.19 kg/m     Wt Readings from Last 3 Encounters:  04/13/23 175 lb 6.4 oz (79.6 kg)  05/26/16 167 lb (75.8 kg)  05/19/16 167 lb (75.8 kg)     GEN:  Well nourished, well developed in no acute distress HEENT: Normal NECK: No JVD; No carotid bruits LYMPHATICS: No lymphadenopathy CARDIAC: RRR, no murmurs, rubs, gallops RESPIRATORY:  Clear to auscultation  without rales, wheezing or rhonchi  ABDOMEN: Soft, non-tender, non-distended MUSCULOSKELETAL:  No edema; No deformity  SKIN: Warm and dry NEUROLOGIC:  Alert and oriented x 3 PSYCHIATRIC:  Normal affect     Signed, Norman Herrlich, MD  04/13/2023 4:23 PM    [facility]

## 2023-04-13 ENCOUNTER — Encounter: Payer: Self-pay | Admitting: Cardiology

## 2023-04-13 ENCOUNTER — Ambulatory Visit: Payer: Medicare Other | Attending: Cardiology | Admitting: Cardiology

## 2023-04-13 VITALS — BP 124/60 | HR 84 | Ht 65.0 in | Wt 175.4 lb

## 2023-04-13 DIAGNOSIS — R079 Chest pain, unspecified: Secondary | ICD-10-CM | POA: Diagnosis not present

## 2023-04-13 DIAGNOSIS — R072 Precordial pain: Secondary | ICD-10-CM | POA: Diagnosis not present

## 2023-04-13 DIAGNOSIS — M069 Rheumatoid arthritis, unspecified: Secondary | ICD-10-CM | POA: Diagnosis present

## 2023-04-13 MED ORDER — METOPROLOL TARTRATE 100 MG PO TABS: 100.0000 mg | ORAL_TABLET | Freq: Once | ORAL | 0 refills | Status: DC

## 2023-04-13 MED ORDER — ASPIRIN 81 MG PO TBEC: 81.0000 mg | DELAYED_RELEASE_TABLET | Freq: Every day | ORAL | 3 refills | Status: DC

## 2023-04-13 MED ORDER — METOPROLOL SUCCINATE ER 25 MG PO TB24: 25.0000 mg | ORAL_TABLET | Freq: Every day | ORAL | 3 refills | Status: DC

## 2023-04-13 NOTE — Patient Instructions (Addendum)
Medication Instructions:  Your physician has recommended you make the following change in your medication:   START: Toprol XL 25 mg daily START: Aspirin 81 mg daily.  *If you need a refill on your cardiac medications before your next appointment, please call your pharmacy*   Lab Work: Your physician recommends that you return for lab work in:   Labs today: BMP  If you have labs (blood work) drawn today and your tests are completely normal, you will receive your results only by: MyChart Message (if you have MyChart) OR A paper copy in the mail If you have any lab test that is abnormal or we need to change your treatment, we will call you to review the results.   Testing/Procedures:   Your cardiac CT will be scheduled at one of the below locations:   [facility] [address] [place], [place] [address] [phone]  OR  [facility] [address] [place], [place] [address] [phone]  OR   [facility] [address] [place], [place] [address] [phone]  If scheduled at [facility], please arrive at the [facility] Entrance (Entrance C2) of [facility] 30 minutes prior to test start time. You can use the FREE valet parking offered at entrance C (encouraged to control the heart rate for the test)  Proceed to the [facility] Radiology Department (first floor) to check-in and test prep.  All radiology patients and guests should use entrance C2 at [facility], accessed from [facility], even though the hospital's physical address listed is [address]    If scheduled at [facility] or [facility], please arrive 15 mins early for check-in and test prep.  There is spacious parking and easy access to the radiology department from the [facility] entrance. Please enter  here and check-in with the desk attendant.   Please follow these instructions carefully (unless otherwise directed):  An IV will be required for this test and Nitroglycerin will be given.  Hold all erectile dysfunction medications at least 3 days (72 hrs) prior to test. (Ie viagra, cialis, sildenafil, tadalafil, etc)   On the Night Before the Test: Be sure to Drink plenty of water. Do not consume any caffeinated/decaffeinated beverages or chocolate 12 hours prior to your test. Do not take any antihistamines 12 hours prior to your test.  On the Day of the Test: Drink plenty of water until 1 hour prior to the test. Do not eat any food 1 hour prior to test. You may take your regular medications prior to the test.  Take metoprolol (Lopressor) two hours prior to test. FEMALES- please wear underwire-free bra if available, avoid dresses & tight clothing       After the Test: Drink plenty of water. After receiving IV contrast, you may experience a mild flushed feeling. This is normal. On occasion, you may experience a mild rash up to 24 hours after the test. This is not dangerous. If this occurs, you can take Benadryl 25 mg and increase your fluid intake. If you experience trouble breathing, this can be serious. If it is severe call 911 IMMEDIATELY. If it is mild, please call our office. If you take any of these medications: Glipizide/Metformin, Avandament, Glucavance, please do not take 48 hours after completing test unless otherwise instructed.  We will call to schedule your test 2-4 weeks out understanding  that some insurance companies will need an authorization prior to the service being performed.   For more information and frequently asked questions, please visit our website : [email or web address]  For non-scheduling related questions, please contact the cardiac imaging nurse navigator should you have any questions/concerns: Cardiac Imaging Nurse Navigators Direct Office  Dial: [phone]   For scheduling needs, including cancellations and rescheduling, please call [place], [phone].    Follow-Up: At [facility], you and your health needs are our priority.  As part of our continuing mission to provide you with exceptional heart care, we have created designated Provider Care Teams.  These Care Teams include your primary Cardiologist (physician) and Advanced Practice Providers (APPs -  Physician Assistants and Nurse Practitioners) who all work together to provide you with the care you need, when you need it.  We recommend signing up for the patient portal called "MyChart".  Sign up information is provided on this After Visit Summary.  MyChart is used to connect with patients for Virtual Visits (Telemedicine).  Patients are able to view lab/test results, encounter notes, upcoming appointments, etc.  Non-urgent messages can be sent to your provider as well.   To learn more about what you can do with MyChart, go to [email or web address].    Your next appointment:   6 week(s)  Provider:   Norman Herrlich, MD    Other Instructions None

## 2023-04-16 LAB — BASIC METABOLIC PANEL
BUN/Creatinine Ratio: 16 (ref 12–28)
BUN: 11 mg/dL (ref 8–27)
CO2: 21 mmol/L (ref 20–29)
Calcium: 8.8 mg/dL (ref 8.7–10.3)
Chloride: 107 mmol/L — ABNORMAL HIGH (ref 96–106)
Creatinine, Ser: 0.68 mg/dL (ref 0.57–1.00)
Glucose: 119 mg/dL — ABNORMAL HIGH (ref 70–99)
Potassium: 4.4 mmol/L (ref 3.5–5.2)
Sodium: 142 mmol/L (ref 134–144)
eGFR: 94 mL/min/{1.73_m2} (ref >59)

## 2023-04-20 ENCOUNTER — Encounter (HOSPITAL_COMMUNITY): Payer: Self-pay

## 2023-04-20 ENCOUNTER — Telehealth: Payer: Self-pay

## 2023-04-20 NOTE — Telephone Encounter (Signed)
-----   Message from Norman Herrlich sent at 04/16/2023  7:46 AM EDT ----- Normal or stable result

## 2023-04-20 NOTE — Telephone Encounter (Signed)
Left vm to return call for results.

## 2023-04-21 ENCOUNTER — Telehealth (HOSPITAL_COMMUNITY): Payer: Self-pay | Admitting: Emergency Medicine

## 2023-04-21 NOTE — Telephone Encounter (Signed)
Attempted to call patient regarding upcoming cardiac CT appointment. °Left message on voicemail with name and callback number °Dwan Fennel RN Navigator Cardiac Imaging °[facility] °[phone] Office °[phone] Cell ° °

## 2023-04-22 ENCOUNTER — Ambulatory Visit (HOSPITAL_COMMUNITY): Payer: Medicare Other

## 2023-04-23 ENCOUNTER — Ambulatory Visit (HOSPITAL_COMMUNITY)
Admission: RE | Admit: 2023-04-23 | Discharge: 2023-04-23 | Disposition: A | Discharge status: Home / Self Care | Payer: Medicare Other | Source: Ambulatory Visit | Attending: Cardiology | Admitting: Cardiology

## 2023-04-23 DIAGNOSIS — R072 Precordial pain: Secondary | ICD-10-CM | POA: Diagnosis present

## 2023-04-23 MED ORDER — NITROGLYCERIN 0.4 MG SL SUBL
SUBLINGUAL_TABLET | SUBLINGUAL | Status: AC
  Filled 2023-04-23: qty 2

## 2023-04-23 MED ORDER — IOHEXOL 350 MG/ML SOLN
95.0000 mL | Freq: Once | INTRAVENOUS | Status: AC | PRN
  Administered 2023-04-23: 95 mL via INTRAVENOUS

## 2023-04-23 MED ORDER — NITROGLYCERIN 0.4 MG SL SUBL
0.8000 mg | SUBLINGUAL_TABLET | Freq: Once | SUBLINGUAL | Status: AC
  Administered 2023-04-23: 0.8 mg via SUBLINGUAL

## 2023-05-13 DIAGNOSIS — R29818 Other symptoms and signs involving the nervous system: Secondary | ICD-10-CM

## 2023-05-13 HISTORY — DX: Other symptoms and signs involving the nervous system: R29.818

## 2023-05-24 NOTE — Progress Notes (Unsigned)
Cardiology Office Note:    Date:  05/25/2023   ID:  Shannon Conner, DOB [birth year], MRN [id number]  PCP:  Street, Stephanie Coup, MD  Cardiologist:  Norman Herrlich, MD    Referring MD: [address] Stephanie Coup, *    ASSESSMENT:    1. Mild CAD   2. Mixed hyperlipidemia   3. Rheumatoid arthritis, involving unspecified site, unspecified whether rheumatoid factor present (HCC)    PLAN:    In order of problems listed above:  Stable I think in her case with a score of 1 and rheumatoid arthritis with prominent symptoms we should stop statin stop aspirin and continue the low-dose beta-blocker for coronary microvascular disease or coronary artery spasm.  She also has nitroglycerin to take as needed   Next appointment: 1 year   Medication Adjustments/Labs and Tests Ordered: Current medicines are reviewed at length with the patient today.  Concerns regarding medicines are outlined above.  No orders of the defined types were placed in this encounter.  No orders of the defined types were placed in this encounter.    History of Present Illness:    Shannon Conner is a 72 y.o. female with a hx of angina and rheumatoid arthritis last seen 04/13/2023.  Cardiac CTA reported in 04/2023 coronary calcium score (1 and minimal CAD less than 25% mixed plaque in the right coronary artery.  Compliance with diet, lifestyle and medications: Yes  She is having much more prominent joint and radicular pain. Her cardiac CTA was very close to normal with a calcium score of 1 and minimal atherosclerotic mixed plaque in the right coronary artery Lower score is not 0 I think she is at increased risk of side effects of the statin with rheumatoid arthritis I think at this point in time we should put aspirin and a statin aside but she has done better and will continue her beta-blocker.  She may have microvascular angina or coronary artery spasm and she has a prescription for nitroglycerin if needed Past Medical  History:  Diagnosis Date   Arthritis    RA   Chronic right-sided low back pain    GERD (gastroesophageal reflux disease)    History of seasonal allergies    Hypothyroidism    Sleep apnea    "mild"- no tx   Tuberculosis     Current Medications: Current Meds  Medication Sig   amitriptyline (ELAVIL) 50 MG tablet Take 50 mg by mouth at bedtime.   Ascorbic Acid (VITAMIN C WITH ROSE HIPS) 500 MG tablet Take 500 mg by mouth daily.   Calcium-Magnesium-Vitamin D (CALCIUM 1200+D3 PO) Take 1 tablet by mouth at bedtime.   Cholecalciferol (VITAMIN D3) 2000 units TABS Take 2,000 Units by mouth daily.   Cyanocobalamin (B-12 COMPLIANCE INJECTION IJ) Inject 1 Dose as directed every 30 (thirty) days.   denosumab (PROLIA) 60 MG/ML SOSY injection Inject 60 mg into the skin every 6 (six) months.   folic acid (FOLVITE) 1 MG tablet Take 1 mg by mouth daily.   levothyroxine (SYNTHROID, LEVOTHROID) 88 MCG tablet Take 88 mcg by mouth daily before breakfast.   loratadine (CLARITIN) 10 MG tablet Take 10 mg by mouth daily as needed for allergies.   methotrexate (RHEUMATREX) 2.5 MG tablet Take 2.5 mg by mouth once a week. Caution:Chemotherapy. Protect from light.   metoprolol succinate (TOPROL XL) 25 MG 24 hr tablet Take 1 tablet (25 mg total) by mouth daily.   Multiple Vitamins-Minerals (ICAPS AREDS 2) CAPS Take 2 capsules by  mouth 2 (two) times daily.    naproxen sodium (ALEVE) 220 MG tablet Take 220 mg by mouth daily as needed (pain).   omeprazole (PRILOSEC) 20 MG capsule Take 20 mg by mouth 2 (two) times daily.   Polyethyl Glycol-Propyl Glycol (SYSTANE ULTRA OP) Apply 1 drop to eye daily as needed (dry eyes).   polyethylene glycol (MIRALAX / GLYCOLAX) packet Take 17 g by mouth 2 (two) times daily.   sodium chloride (OCEAN) 0.65 % SOLN nasal spray Place 1 spray into both nostrils as needed for congestion.   zinc gluconate 50 MG tablet Take 50 mg by mouth daily.   [DISCONTINUED] aspirin EC 81 MG tablet Take 1  tablet (81 mg total) by mouth daily. Swallow whole.   [DISCONTINUED] metoprolol tartrate (LOPRESSOR) 100 MG tablet Take 1 tablet (100 mg total) by mouth once for 1 dose. Please take this medication 2 hours before CT.   [DISCONTINUED] oxyCODONE (OXY IR/ROXICODONE) 5 MG immediate release tablet Take 1-2 tablets (5-10 mg total) by mouth every 4 (four) hours as needed for severe pain.   [DISCONTINUED] rosuvastatin (CRESTOR) 20 MG tablet Take 20 mg by mouth every evening.      EKGs/Labs/Other Studies Reviewed:    The following studies were reviewed today:  Cardiac Studies & Procedures          CT SCANS  CT CORONARY MORPH W/CTA COR W/SCORE 04/23/2023  Addendum 05/16/2023  3:08 PM ADDENDUM REPORT: 05/16/2023 15:06  EXAM: OVER-READ INTERPRETATION  CT CHEST  The following report is an over-read performed by radiologist Dr. Curly Shores [facility] on 05/16/2023. This over-read does not include interpretation of cardiac or coronary anatomy or pathology. The coronary CTA interpretation by the cardiologist is attached.  COMPARISON:  None.  FINDINGS: Cardiovascular:  See findings discussed in the body of the report.  Mediastinum/Nodes: No suspicious adenopathy identified. Imaged mediastinal structures are unremarkable.  Lungs/Pleura: Imaged lungs are clear. No pleural effusion or pneumothorax.  Upper Abdomen: No acute abnormality.  Musculoskeletal: No chest wall abnormality. No acute osseous findings.  IMPRESSION: No acute extracardiac incidental findings identified.   Electronically Signed By: Layla Maw M.D. On: 05/16/2023 15:06  Narrative HISTORY: Chest pain, nonspecific  EXAM: Cardiac/Coronary  CT  TECHNIQUE: The patient was scanned on a [employer].  PROTOCOL: A 120 kV prospective scan was triggered in the descending thoracic aorta at 111 HU's. Axial non-contrast 3 mm slices were carried out through the heart. The data set was  analyzed on a dedicated work station and scored using the Agatston method. Gantry rotation speed was 250 msecs and collimation was .6 mm. Beta blockade and 0.8 mg of sl NTG was given. The 3D data set was reconstructed in 5% intervals of the 35-75 % of the R-R cycle. Systolic and diastolic phases were analyzed on a dedicated work station using MPR, MIP and VRT modes. The patient received contrast: 95mL OMNIPAQUE IOHEXOL 350 MG/ML SOLN.  FINDINGS: Image quality: Good  Noise artifact is: Limited  Coronary calcium score is 1, 40th percentile  Coronary arteries: Normal coronary origins.  Right dominance.  Right Coronary Artery: Minimal mixed plaque in the ostial RCA, <25% stenosis.  Left Main Coronary Artery: No detectable plaque or stenosis.  Left Anterior Descending Coronary Artery: No detectable plaque or stenosis.  Left Circumflex Artery: No detectable plaque or stenosis.  Aorta: Normal size, 30 mm at the mid ascending aorta (level of the PA bifurcation) measured double oblique.  Aortic Valve: No calcifications.  Other  findings:  Normal variant pulmonary vein drainage into the left atrium (3 right 2 left).  Normal left atrial appendage without thrombus.  Normal size of the pulmonary artery.  Please see separate report from [facility] for non-cardiac findings.  IMPRESSION: 1. Minimal CAD, <25% stenosis, CADRADS 1.  2. Coronary calcium score of 1. This was 40th percentile for age and sex matched control.  3. Normal coronary origins with right dominance.  RECOMMENDATIONS: CAD-RADS 1. Minimal non-obstructive CAD (0-24%). Consider non-atherosclerotic causes of chest pain. Consider preventive therapy and risk factor modification.  Electronically Signed: By: Weston Brass M.D. On: 04/23/2023 16:17              Recent Labs: 04/15/2023: BUN 11; Creatinine, Ser 0.68; Potassium 4.4; Sodium 142  Recent Lipid Panel No results found for: "CHOL",  "TRIG", "HDL", "CHOLHDL", "VLDL", "LDLCALC", "LDLDIRECT"  Physical Exam:    VS:  BP 116/68 (BP Location: Left Arm, Patient Position: Sitting)   Pulse 74   Ht 5\' 5"  (1.651 m)   Wt 178 lb 12.8 oz (81.1 kg)   SpO2 93%   BMI 29.75 kg/m     Wt Readings from Last 3 Encounters:  05/25/23 178 lb 12.8 oz (81.1 kg)  04/13/23 175 lb 6.4 oz (79.6 kg)  05/26/16 167 lb (75.8 kg)     GEN:  Well nourished, well developed in no acute distress HEENT: Normal NECK: No JVD; No carotid bruits LYMPHATICS: No lymphadenopathy CARDIAC: RRR, no murmurs, rubs, gallops RESPIRATORY:  Clear to auscultation without rales, wheezing or rhonchi  ABDOMEN: Soft, non-tender, non-distended MUSCULOSKELETAL:  No edema; No deformity  SKIN: Warm and dry NEUROLOGIC:  Alert and oriented x 3 PSYCHIATRIC:  Normal affect    Signed, Norman Herrlich, MD  05/25/2023 4:39 PM    [facility]

## 2023-05-25 ENCOUNTER — Encounter: Payer: Self-pay | Admitting: Cardiology

## 2023-05-25 ENCOUNTER — Ambulatory Visit: Payer: Medicare Other | Attending: Cardiology | Admitting: Cardiology

## 2023-05-25 VITALS — BP 116/68 | HR 74 | Ht 65.0 in | Wt 178.8 lb

## 2023-05-25 DIAGNOSIS — M069 Rheumatoid arthritis, unspecified: Secondary | ICD-10-CM | POA: Insufficient documentation

## 2023-05-25 DIAGNOSIS — I2510 Atherosclerotic heart disease of native coronary artery without angina pectoris: Secondary | ICD-10-CM | POA: Insufficient documentation

## 2023-05-25 DIAGNOSIS — E782 Mixed hyperlipidemia: Secondary | ICD-10-CM | POA: Insufficient documentation

## 2023-05-25 NOTE — Patient Instructions (Signed)
Medication Instructions:  Your physician has recommended you make the following change in your medication:   STOP: Aspirin STOP: Rosuvastatin  *If you need a refill on your cardiac medications before your next appointment, please call your pharmacy*   Lab Work: None If you have labs (blood work) drawn today and your tests are completely normal, you will receive your results only by: MyChart Message (if you have MyChart) OR A paper copy in the mail If you have any lab test that is abnormal or we need to change your treatment, we will call you to review the results.   Testing/Procedures: None   Follow-Up: At [facility], you and your health needs are our priority.  As part of our continuing mission to provide you with exceptional heart care, we have created designated Provider Care Teams.  These Care Teams include your primary Cardiologist (physician) and Advanced Practice Providers (APPs -  Physician Assistants and Nurse Practitioners) who all work together to provide you with the care you need, when you need it.  We recommend signing up for the patient portal called "MyChart".  Sign up information is provided on this After Visit Summary.  MyChart is used to connect with patients for Virtual Visits (Telemedicine).  Patients are able to view lab/test results, encounter notes, upcoming appointments, etc.  Non-urgent messages can be sent to your provider as well.   To learn more about what you can do with MyChart, go to [email or web address].    Your next appointment:   1 year(s)  Provider:   Norman Herrlich, MD    Other Instructions None

## 2023-06-20 DIAGNOSIS — M5416 Radiculopathy, lumbar region: Secondary | ICD-10-CM | POA: Insufficient documentation

## 2023-06-20 HISTORY — DX: Radiculopathy, lumbar region: M54.16

## 2023-06-22 ENCOUNTER — Other Ambulatory Visit: Payer: Self-pay | Admitting: Physical Medicine and Rehabilitation

## 2023-06-22 DIAGNOSIS — M5416 Radiculopathy, lumbar region: Secondary | ICD-10-CM

## 2023-07-20 NOTE — Discharge Instructions (Signed)

## 2023-07-21 ENCOUNTER — Ambulatory Visit
Admission: RE | Admit: 2023-07-21 | Discharge: 2023-07-21 | Disposition: A | Discharge status: Home / Self Care | Payer: Medicare Other | Source: Ambulatory Visit | Attending: Physical Medicine and Rehabilitation | Admitting: Physical Medicine and Rehabilitation

## 2023-07-21 DIAGNOSIS — M5416 Radiculopathy, lumbar region: Secondary | ICD-10-CM

## 2023-07-21 MED ORDER — MEPERIDINE HCL 50 MG/ML IJ SOLN: 50.0000 mg | Freq: Once | INTRAMUSCULAR | Status: DC | PRN

## 2023-07-21 MED ORDER — IOPAMIDOL (ISOVUE-M 200) INJECTION 41%
20.0000 mL | Freq: Once | INTRAMUSCULAR | Status: AC
  Administered 2023-07-21: 20 mL via INTRATHECAL

## 2023-07-21 MED ORDER — ONDANSETRON HCL 4 MG/2ML IJ SOLN: 4.0000 mg | Freq: Once | INTRAMUSCULAR | Status: DC | PRN

## 2023-07-21 MED ORDER — DIAZEPAM 5 MG PO TABS: 5.0000 mg | ORAL_TABLET | Freq: Once | ORAL | Status: DC

## 2023-11-09 DIAGNOSIS — M1990 Unspecified osteoarthritis, unspecified site: Secondary | ICD-10-CM | POA: Insufficient documentation

## 2023-11-09 DIAGNOSIS — M545 Low back pain, unspecified: Secondary | ICD-10-CM | POA: Insufficient documentation

## 2023-11-09 DIAGNOSIS — A159 Respiratory tuberculosis unspecified: Secondary | ICD-10-CM | POA: Insufficient documentation

## 2023-11-09 DIAGNOSIS — E039 Hypothyroidism, unspecified: Secondary | ICD-10-CM | POA: Insufficient documentation

## 2023-11-09 DIAGNOSIS — K219 Gastro-esophageal reflux disease without esophagitis: Secondary | ICD-10-CM | POA: Insufficient documentation

## 2023-11-09 DIAGNOSIS — G4730 Sleep apnea, unspecified: Secondary | ICD-10-CM | POA: Insufficient documentation

## 2023-11-09 DIAGNOSIS — Z889 Allergy status to unspecified drugs, medicaments and biological substances status: Secondary | ICD-10-CM | POA: Insufficient documentation

## 2023-11-10 ENCOUNTER — Ambulatory Visit

## 2023-11-10 DIAGNOSIS — A159 Respiratory tuberculosis unspecified: Secondary | ICD-10-CM

## 2023-11-10 DIAGNOSIS — M1990 Unspecified osteoarthritis, unspecified site: Secondary | ICD-10-CM

## 2023-11-10 DIAGNOSIS — Z889 Allergy status to unspecified drugs, medicaments and biological substances status: Secondary | ICD-10-CM

## 2023-11-10 NOTE — Telephone Encounter (Signed)
 Rescheduled

## 2023-11-13 ENCOUNTER — Other Ambulatory Visit: Payer: Self-pay

## 2023-11-16 ENCOUNTER — Ambulatory Visit

## 2023-11-16 VITALS — BP 116/74 | HR 76 | Ht 65.0 in | Wt 175.8 lb

## 2023-11-16 DIAGNOSIS — I2510 Atherosclerotic heart disease of native coronary artery without angina pectoris: Secondary | ICD-10-CM | POA: Diagnosis present

## 2023-11-16 MED ORDER — ASPIRIN 81 MG PO TBEC: 81.0000 mg | DELAYED_RELEASE_TABLET | Freq: Every day | ORAL | 3 refills | Status: AC

## 2023-11-16 NOTE — Assessment & Plan Note (Signed)
 Minimal coronary atherosclerosis on cardiac CT coronary angiogram from October 2024. She has been told her symptoms were reflective of angina back at age 73. Remains okay at this time. Symptoms do not appear anginal in description at this time.  Reassured her. Recommended to start aspirin  81 mg once daily. Will obtain transthoracic echocardiogram to rule out any significant cardiac structural and functional issues and rule out any significant concerns with pericardial effusion given her longstanding history of rheumatoid arthritis.

## 2023-11-16 NOTE — Progress Notes (Signed)
 Cardiology Consultation:    Date:  11/16/2023   ID:  Shannon Conner, Shannon Conner 1951/01/14, MRN [id number]  PCP:  Street, Renford Cartwright, MD  Cardiologist:  Daymon Evans Shannon Swopes, MD   Referring MD: [address] Renford Cartwright, *   No chief complaint on file.    ASSESSMENT AND PLAN:   [name] 73 year old woman with minimal nonobstructive coronary artery disease on cardiac CT coronary angiogram from October 2024, hypothyroidism, mild sleep apnea-not on CPAP, GERD, chronic back pain suspected to be secondary to spinal stenosis with referred pain to the right lower extremity limiting her mobility Problem List Items Addressed This Visit     Coronary atherosclerosis - Primary   Minimal coronary atherosclerosis on cardiac CT coronary angiogram from October 2024. She has been told her symptoms were reflective of angina back at age 67. Remains okay at this time. Symptoms do not appear anginal in description at this time.  Reassured her. Recommended to start aspirin  81 mg once daily. Will obtain transthoracic echocardiogram to rule out any significant cardiac structural and functional issues and rule out any significant concerns with pericardial effusion given her longstanding history of rheumatoid arthritis.        Return to clinic tentatively in 3 months to follow-up on test and interval symptoms.    History of Present Illness:    Shannon Conner is a 73 y.o. female who is being seen today for follow-up visit. PCP is Street, [place], *. Last visit with our office was 05/25/2023 with Dr. Sandee Crook.  She has history of minimal nonobstructive coronary artery disease on cardiac CTA October 2024 [calcium  score 1, less than 25% mixed plaque in RCA], rheumatoid arthritis, hypothyroidism, mild sleep apnea-not on CPAP, GERD, chronic back pain.   Here for the visit accompanied by her husband. Mentions since February she has been dealing with upper respiratory symptoms. Mentions with  persistent cough she has been having right upper neck pain radiating to the right upper side of the chest in the front.  This is improved over the last few weeks but she continues to have intermittent symptoms with coughing.  Denies any symptoms with activity such as chest pain or shortness of breath. Limited mobility due to right knee pain referred from her back pain suspected to be from spinal stenosis.     Past Medical History:  Diagnosis Date   Arthritis    RA   Chronic right-sided low back pain    GERD (gastroesophageal reflux disease)    History of seasonal allergies    Hypothyroidism    Lumbar radiculopathy 06/20/2023   Neurogenic claudication 05/13/2023   Pain of right hip joint 05/07/2018   Right gluteus tear 05/26/2016   Sleep apnea    "mild"- no tx   Tendonitis of knee, right 05/29/2020   Tuberculosis     Past Surgical History:  Procedure Laterality Date   BREAST SURGERY Bilateral    cyst removal one, "tumor removed from the other"   COCHLEAR IMPLANT Right    x 2   EXCISION/RELEASE BURSA HIP Right 05/26/2016   Procedure: OPEN BURSECTOMY  AND ILEOTIBIAL BAND RESCESSION;  Surgeon: Claiborne Crew, MD;  Location: [facility];  Service: Orthopedics;  Laterality: Right;   FRACTURE SURGERY     nose   NASAL SINUS SURGERY     x 4/states sever back pain following this most recent one- 9/17    Current Medications: Current Meds  Medication Sig   benzonatate (TESSALON) 200 MG capsule Take 400  mg by mouth every 8 (eight) hours as needed for cough.   Cyanocobalamin (B-12 COMPLIANCE INJECTION IJ) Inject 1 Dose as directed every 30 (thirty) days.   denosumab (PROLIA) 60 MG/ML SOSY injection Inject 60 mg into the skin every 6 (six) months.   folic acid (FOLVITE) 1 MG tablet Take 1 mg by mouth daily.   levothyroxine  (SYNTHROID , LEVOTHROID) 88 MCG tablet Take 88 mcg by mouth daily before breakfast. And patient takes 2 tablets by mouth on Saturday and Sunday.   loratadine  (CLARITIN ) 10  MG tablet Take 10 mg by mouth daily as needed for allergies.   methotrexate (RHEUMATREX) 2.5 MG tablet Take 2.5 mg by mouth once a week. Caution:Chemotherapy. Protect from light.   metoprolol  succinate (TOPROL  XL) 25 MG 24 hr tablet Take 1 tablet (25 mg total) by mouth daily.   naproxen sodium (ALEVE) 220 MG tablet Take 220 mg by mouth daily as needed (pain).   omeprazole  (PRILOSEC) 20 MG capsule Take 20 mg by mouth 2 (two) times daily.   Polyethyl Glycol-Propyl Glycol (SYSTANE ULTRA OP) Apply 1 drop to eye daily as needed (dry eyes).   promethazine-dextromethorphan (PROMETHAZINE-DM) 6.25-15 MG/5ML syrup Take 5 mLs by mouth at bedtime as needed for cough.   sodium chloride  (OCEAN) 0.65 % SOLN nasal spray Place 1 spray into both nostrils as needed for congestion.   traMADol (ULTRAM) 50 MG tablet Take 50 mg by mouth as needed for moderate pain (pain score 4-6) or severe pain (pain score 7-10).   VENTOLIN HFA 108 (90 Base) MCG/ACT inhaler Inhale 2 puffs into the lungs every 6 (six) hours as needed for wheezing or shortness of breath.   zinc gluconate 50 MG tablet Take 50 mg by mouth daily.     Allergies:   Ibuprofen, Meclofenamate, Other, Propoxyphene, Acetaminophen, and Celecoxib   Social History   Socioeconomic History   Marital status: Married    Spouse name: Not on file   Number of children: Not on file   Years of education: Not on file   Highest education level: Not on file  Occupational History   Not on file  Tobacco Use   Smoking status: Former   Smokeless tobacco: Never  Substance and Sexual Activity   Alcohol use: No   Drug use: No   Sexual activity: Not on file  Other Topics Concern   Not on file  Social History Narrative   Not on file   Social Drivers of Health   Financial Resource Strain: Not on file  Food Insecurity: Not on file  Transportation Needs: Not on file  Physical Activity: Not on file  Stress: Not on file  Social Connections: Not on file     Family  History: The patient's family history includes Diabetes in her father, mother, and sister; Heart disease in her father; Hypertension in her father, mother, and sister. ROS:   Please see the history of present illness.    All 14 point review of systems negative except as described per history of present illness.  EKGs/Labs/Other Studies Reviewed:    The following studies were reviewed today:   EKG:       Recent Labs: 04/15/2023: BUN 11; Creatinine, Ser 0.68; Potassium 4.4; Sodium 142  Recent Lipid Panel No results found for: "CHOL", "TRIG", "HDL", "CHOLHDL", "VLDL", "LDLCALC", "LDLDIRECT"  Physical Exam:    VS:  BP 116/74   Pulse 76   Ht 5\' 5"  (1.651 m)   Wt 175 lb 12.8 oz (79.7 kg)  SpO2 96%   BMI 29.25 kg/m     Wt Readings from Last 3 Encounters:  11/16/23 175 lb 12.8 oz (79.7 kg)  05/25/23 178 lb 12.8 oz (81.1 kg)  04/13/23 175 lb 6.4 oz (79.6 kg)     GENERAL:  Well nourished, well developed in no acute distress NECK: No JVD; No carotid bruits CARDIAC: RRR, S1 and S2 present, no murmurs, no rubs, no gallops CHEST:  Clear to auscultation without rales, wheezing or rhonchi  Extremities: No pitting pedal edema. Pulses bilaterally symmetric with radial 2+ and dorsalis pedis 2+ NEUROLOGIC:  Alert and oriented x 3  Medication Adjustments/Labs and Tests Ordered: Current medicines are reviewed at length with the patient today.  Concerns regarding medicines are outlined above.  No orders of the defined types were placed in this encounter.  No orders of the defined types were placed in this encounter.   Signed, Lura Sallies, MD, MPH, [facility] 11/16/2023 5:02 PM    [facility]

## 2023-11-16 NOTE — Patient Instructions (Signed)
 Medication Instructions:  Your physician has recommended you make the following change in your medication:   Start taking 81 mg aspirin  daily  *If you need a refill on your cardiac medications before your next appointment, please call your pharmacy*   Lab Work: None ordered If you have labs (blood work) drawn today and your tests are completely normal, you will receive your results only by: MyChart Message (if you have MyChart) OR A paper copy in the mail If you have any lab test that is abnormal or we need to change your treatment, we will call you to review the results.   Testing/Procedures: Your physician has requested that you have an echocardiogram. Echocardiography is a painless test that uses sound waves to create images of your heart. It provides your doctor with information about the size and shape of your heart and how well your heart's chambers and valves are working. This procedure takes approximately one hour. There are no restrictions for this procedure. Please do NOT wear cologne, perfume, aftershave, or lotions (deodorant is allowed). Please arrive 15 minutes prior to your appointment time.  Please note: We ask at that you not bring children with you during ultrasound (echo/ vascular) testing. Due to room size and safety concerns, children are not allowed in the ultrasound rooms during exams. Our front office staff cannot provide observation of children in our lobby area while testing is being conducted. An adult accompanying a patient to their appointment will only be allowed in the ultrasound room at the discretion of the ultrasound technician under special circumstances. We apologize for any inconvenience.    Follow-Up: At [facility], you and your health needs are our priority.  As part of our continuing mission to provide you with exceptional heart care, we have created designated Provider Care Teams.  These Care Teams include your primary Cardiologist  (physician) and Advanced Practice Providers (APPs -  Physician Assistants and Nurse Practitioners) who all work together to provide you with the care you need, when you need it.  We recommend signing up for the patient portal called "MyChart".  Sign up information is provided on this After Visit Summary.  MyChart is used to connect with patients for Virtual Visits (Telemedicine).  Patients are able to view lab/test results, encounter notes, upcoming appointments, etc.  Non-urgent messages can be sent to your provider as well.   To learn more about what you can do with MyChart, go to [email or web address].    Your next appointment:   3 month(s)  The format for your next appointment:   In Person  Provider:   Hillis Lu, MD    Other Instructions none  Important Information About Sugar

## 2023-12-10 ENCOUNTER — Ambulatory Visit: Attending: Cardiology

## 2023-12-10 ENCOUNTER — Ambulatory Visit: Payer: Self-pay

## 2023-12-10 DIAGNOSIS — I2510 Atherosclerotic heart disease of native coronary artery without angina pectoris: Secondary | ICD-10-CM | POA: Insufficient documentation

## 2023-12-10 LAB — ECHOCARDIOGRAM COMPLETE
Area-P 1/2: 2.77 cm2
S' Lateral: 2.7 cm

## 2024-02-14 NOTE — Progress Notes (Unsigned)
 Cardiology Office Note:    Date:  02/16/2024   ID:  Shannon Conner, Mcnay September 28, 1950, MRN [id number]  PCP:  Street, Lonni CHRISTELLA, MD  Cardiologist:  Redell Leiter, MD    Referring MD: [address], Lonni CHRISTELLA, *    ASSESSMENT:    1. Nonrheumatic aortic valve insufficiency   2. Mild CAD   3. Mixed hyperlipidemia   4. Rheumatoid arthritis, involving unspecified site, unspecified whether rheumatoid factor present (HCC)    PLAN:    In order of problems listed above:  Reviewed with her that minor degree of aortic regurgitation is not uncommon in her age group no murmur on exam this is clinically insignificant she is reassured and does not require endocarditis prophylaxis Stable she has very mild coronary artery disease not having angina. I negotiated with her to try a very low-dose of a statin she will obviously need a second drug recheck her labs and at that point consider increasing frequency dose of pravastatin  if tolerated adding Zetia Stable rheumatoid arthritis no evidence of cardiovascular involvement   Next appointment: 1 year   Medication Adjustments/Labs and Tests Ordered: Current medicines are reviewed at length with the patient today.  Concerns regarding medicines are outlined above.  No orders of the defined types were placed in this encounter.  No orders of the defined types were placed in this encounter.    History of Present Illness:    Shannon Conner is a 73 y.o. female with a hx of very minor nonobstructive CAD with a coronary calcium  score of 1 angina mixed hyperlipidemia and rheumatoid arthritis last seen in 05/25/2023.  She was seen by my partner Dr. Liborio in my absence 11/16/2023 and an echocardiogram was ordered showing a structurally normal heart with the exception of mild aortic regurgitation.  There was no pericardial disease.  Compliance with diet, lifestyle and medications: Yes  She is reassured that she has no rheumatic involvement from her  rheumatoid arthritis I reviewed that her echocardiogram shows insignificant valvular regurgitation no murmur on physical exam She has very minimal CAD a low calcium  score and is having no anginal discomfort She is extremely reluctant to consider lipid-lowering therapy negotiated she take a low-dose of pravastatin  3 days a week to see if tolerated.  She said she had severe muscle symptoms of the atorvastatin in the past. Past Medical History:  Diagnosis Date   Arthritis    RA   Chronic right-sided low back pain    GERD (gastroesophageal reflux disease)    History of seasonal allergies    Hypothyroidism    Lumbar radiculopathy 06/20/2023   Neurogenic claudication 05/13/2023   Pain of right hip joint 05/07/2018   Right gluteus tear 05/26/2016   Sleep apnea    mild- no tx   Tendonitis of knee, right 05/29/2020   Tuberculosis     Current Medications: No outpatient medications have been marked as taking for the 02/16/24 encounter (Appointment) with Leiter Redell PARAS, MD.      EKGs/Labs/Other Studies Reviewed:    The following studies were reviewed today:         Recent Labs: 04/15/2023: BUN 11; Creatinine, Ser 0.68; Potassium 4.4; Sodium 142  Recent Lipid Panel 10/02/2023 cholesterol 222 HDL 42 non-HDL cholesterol severely elevated 180  Physical Exam:    VS:  There were no vitals taken for this visit.    Wt Readings from Last 3 Encounters:  11/16/23 175 lb 12.8 oz (79.7 kg)  05/25/23 178 lb 12.8 oz (81.1  kg)  04/13/23 175 lb 6.4 oz (79.6 kg)     GEN:  Well nourished, well developed in no acute distress HEENT: Normal NECK: No JVD; No carotid bruits LYMPHATICS: No lymphadenopathy CARDIAC: RRR, no murmurs, rubs, gallops RESPIRATORY:  Clear to auscultation without rales, wheezing or rhonchi  ABDOMEN: Soft, non-tender, non-distended MUSCULOSKELETAL:  No edema; No deformity  SKIN: Warm and dry NEUROLOGIC:  Alert and oriented x 3 PSYCHIATRIC:  Normal affect     Signed, Redell Leiter, MD  02/16/2024 8:08 AM    [facility]

## 2024-02-16 ENCOUNTER — Other Ambulatory Visit: Payer: Self-pay

## 2024-02-16 ENCOUNTER — Ambulatory Visit: Attending: Cardiology | Admitting: Cardiology

## 2024-02-16 VITALS — BP 123/75 | HR 75 | Ht 65.5 in | Wt 172.6 lb

## 2024-02-16 DIAGNOSIS — I351 Nonrheumatic aortic (valve) insufficiency: Secondary | ICD-10-CM | POA: Insufficient documentation

## 2024-02-16 DIAGNOSIS — E782 Mixed hyperlipidemia: Secondary | ICD-10-CM | POA: Insufficient documentation

## 2024-02-16 DIAGNOSIS — I2510 Atherosclerotic heart disease of native coronary artery without angina pectoris: Secondary | ICD-10-CM | POA: Insufficient documentation

## 2024-02-16 DIAGNOSIS — M069 Rheumatoid arthritis, unspecified: Secondary | ICD-10-CM | POA: Diagnosis present

## 2024-02-16 MED ORDER — PRAVASTATIN SODIUM 20 MG PO TABS: 20.0000 mg | ORAL_TABLET | ORAL | 3 refills | Status: DC

## 2024-02-16 NOTE — Patient Instructions (Signed)
 Medication Instructions:  Your physician has recommended you make the following change in your medication:   START: Pravastatin  20 mg every M - W - F  *If you need a refill on your cardiac medications before your next appointment, please call your pharmacy*  Lab Work: Your physician recommends that you return for lab work in:   Labs in 3 months: CMP, Lipids  If you have labs (blood work) drawn today and your tests are completely normal, you will receive your results only by: MyChart Message (if you have MyChart) OR A paper copy in the mail If you have any lab test that is abnormal or we need to change your treatment, we will call you to review the results.  Testing/Procedures: None  Follow-Up: At [facility], you and your health needs are our priority.  As part of our continuing mission to provide you with exceptional heart care, our providers are all part of one team.  This team includes your primary Cardiologist (physician) and Advanced Practice Providers or APPs (Physician Assistants and Nurse Practitioners) who all work together to provide you with the care you need, when you need it.  Your next appointment:   1 year(s)  Provider:   Redell Leiter, MD    We recommend signing up for the patient portal called MyChart.  Sign up information is provided on this After Visit Summary.  MyChart is used to connect with patients for Virtual Visits (Telemedicine).  Patients are able to view lab/test results, encounter notes, upcoming appointments, etc.  Non-urgent messages can be sent to your provider as well.   To learn more about what you can do with MyChart, go to [email or web address].   Other Instructions None

## 2024-03-31 ENCOUNTER — Other Ambulatory Visit: Payer: Self-pay

## 2024-03-31 MED ORDER — PRAVASTATIN SODIUM 20 MG PO TABS: 20.0000 mg | ORAL_TABLET | ORAL | 3 refills | Status: AC

## 2024-04-14 ENCOUNTER — Other Ambulatory Visit: Payer: Self-pay | Admitting: Cardiology

## 2024-06-14 ENCOUNTER — Ambulatory Visit (INDEPENDENT_AMBULATORY_CARE_PROVIDER_SITE_OTHER): Admit: 2024-06-14 | Discharge: 2024-06-14 | Disposition: A | Admitting: Radiology

## 2024-06-14 ENCOUNTER — Ambulatory Visit (HOSPITAL_BASED_OUTPATIENT_CLINIC_OR_DEPARTMENT_OTHER): Admission: EM | Admit: 2024-06-14 | Discharge: 2024-06-14 | Disposition: A | Discharge status: Home / Self Care

## 2024-06-14 ENCOUNTER — Encounter (HOSPITAL_BASED_OUTPATIENT_CLINIC_OR_DEPARTMENT_OTHER): Payer: Self-pay

## 2024-06-14 DIAGNOSIS — R051 Acute cough: Secondary | ICD-10-CM

## 2024-06-14 NOTE — ED Provider Notes (Signed)
 [facility]    CSN: [id number] Arrival date & time: 06/14/24  1317      History   Chief Complaint Chief Complaint  Patient presents with   Cough    HPI Shannon Conner is a 73 y.o. female.   Pt c/o cough, nasal congestion, and left ear itching for the last 3 weeks. She was seen previously by a different urgent care and was prescribed Levaquin for 1 week and she test neg for covid. The abx did not change or improve anything. She has also taken robitussin and mucinex with slight relief. She is scheduled to have a spinal stenosis performed tomorrow and wanted to get checked out to make sure she was ok enough for the procedure. She also reports several cold sores and a sore in her nose that she would like evaluated as well.    Cough   Past Medical History:  Diagnosis Date   Arthritis    RA   Chronic right-sided low back pain    GERD (gastroesophageal reflux disease)    History of seasonal allergies    Hypothyroidism    Lumbar radiculopathy 06/20/2023   Neurogenic claudication 05/13/2023   Pain of right hip joint 05/07/2018   Right gluteus tear 05/26/2016   Sleep apnea    mild- no tx   Tendonitis of knee, right 05/29/2020   Tuberculosis     Patient Active Problem List   Diagnosis Date Noted   Coronary atherosclerosis 11/16/2023   Arthritis    Chronic right-sided low back pain    GERD (gastroesophageal reflux disease)    History of seasonal allergies    Hypothyroidism    Sleep apnea    Tuberculosis    Lumbar radiculopathy 06/20/2023   Neurogenic claudication 05/13/2023   Tendonitis of knee, right 05/29/2020   Pain of right hip joint 05/07/2018   Right gluteus tear 05/26/2016    Past Surgical History:  Procedure Laterality Date   BREAST SURGERY Bilateral    cyst removal one, tumor removed from the other   COCHLEAR IMPLANT Right    x 2   EXCISION/RELEASE BURSA HIP Right 05/26/2016   Procedure: OPEN BURSECTOMY  AND ILEOTIBIAL BAND RESCESSION;   Surgeon: Donnice Car, MD;  Location: [facility];  Service: Orthopedics;  Laterality: Right;   FRACTURE SURGERY     nose   NASAL SINUS SURGERY     x 4/states sever back pain following this most recent one- 9/17    OB History   No obstetric history on file.      Home Medications    Prior to Admission medications   Medication Sig Start Date End Date Taking? Authorizing Provider  loratadine  (CLARITIN ) 10 MG tablet Take 10 mg by mouth daily as needed for allergies.   Yes [provider]  traMADol (ULTRAM) 50 MG tablet Take by mouth every 6 (six) hours as needed.   Yes [provider]  amitriptyline  (ELAVIL ) 50 MG tablet Take 50 mg by mouth at bedtime.    [provider]  aspirin  EC 81 MG tablet Take 1 tablet (81 mg total) by mouth daily. Swallow whole. 11/16/23   Madireddy, Alean SAUNDERS, MD  Cyanocobalamin (B-12 COMPLIANCE INJECTION IJ) Inject 1 Dose as directed every 30 (thirty) days.    [provider]  denosumab (PROLIA) 60 MG/ML SOSY injection Inject 60 mg into the skin every 6 (six) months.    [provider]  folic acid (FOLVITE) 1 MG tablet Take 1 mg by  mouth daily.    [provider]  levothyroxine  (SYNTHROID , LEVOTHROID) 88 MCG tablet Take 88 mcg by mouth daily before breakfast. And patient takes 2 tablets by mouth on Saturday and Sunday.    [provider]  methotrexate (RHEUMATREX) 2.5 MG tablet Take 2.5 mg by mouth once a week. Caution:Chemotherapy. Protect from light.    [provider]  metoprolol  succinate (TOPROL -XL) 25 MG 24 hr tablet Take 1 tablet by mouth once daily 04/14/24   Monetta Redell PARAS, MD  omeprazole  (PRILOSEC) 20 MG capsule Take 20 mg by mouth 2 (two) times daily.    [provider]  Polyethyl Glycol-Propyl Glycol (SYSTANE ULTRA OP) Apply 1 drop to eye daily as needed (dry eyes).    [provider]  pravastatin  (PRAVACHOL ) 20 MG tablet Take 1 tablet (20 mg total) by mouth every  Monday, Wednesday, and Friday. 04/01/24   Monetta Redell PARAS, MD  zinc gluconate 50 MG tablet Take 50 mg by mouth daily.    [provider]    Family History Family History  Problem Relation Age of Onset   Hypertension Mother    Diabetes Mother    Diabetes Father    Heart disease Father    Hypertension Father    Diabetes Sister    Hypertension Sister     Social History Social History   Tobacco Use   Smoking status: Former   Smokeless tobacco: Never  Substance Use Topics   Alcohol use: No   Drug use: No     Allergies   Ibuprofen, Meclofenamate, Other, Propoxyphene, Acetaminophen, and Celecoxib   Review of Systems Review of Systems  Respiratory:  Positive for cough.      Physical Exam Triage Vital Signs ED Triage Vitals  Encounter Vitals Group     BP 06/14/24 1340 113/74     Girls Systolic BP Percentile --      Girls Diastolic BP Percentile --      Boys Systolic BP Percentile --      Boys Diastolic BP Percentile --      Pulse Rate 06/14/24 1340 80     Resp 06/14/24 1340 20     Temp 06/14/24 1340 98.1 F (36.7 C)     Temp Source 06/14/24 1340 Oral     SpO2 06/14/24 1340 93 %     Weight --      Height --      Head Circumference --      Peak Flow --      Pain Score 06/14/24 1335 3     Pain Loc --      Pain Education --      Exclude from Growth Chart --    No data found.  Updated Vital Signs BP 113/74 (BP Location: Right Arm)   Pulse 80   Temp 98.1 F (36.7 C) (Oral)   Resp 20   SpO2 93%   Visual Acuity Right Eye Distance:   Left Eye Distance:   Bilateral Distance:    Right Eye Near:   Left Eye Near:    Bilateral Near:     Physical Exam Constitutional:      General: She is not in acute distress.    Appearance: Normal appearance. She is not ill-appearing, toxic-appearing or diaphoretic.  HENT:     Head: Normocephalic and atraumatic.     Right Ear: Tympanic membrane and ear canal normal.     Left Ear: Tympanic membrane and ear canal  normal.  Nose:     Comments: Healing erythematous sores inside both nares.     Mouth/Throat:     Pharynx: Oropharynx is clear.     Comments: A few healing lesions to right upper lip area.  Eyes:     Conjunctiva/sclera: Conjunctivae normal.  Cardiovascular:     Rate and Rhythm: Normal rate and regular rhythm.     Pulses: Normal pulses.     Heart sounds: Normal heart sounds.  Pulmonary:     Effort: Pulmonary effort is normal.     Breath sounds: Normal breath sounds.     Comments: Slight expiratory wheeze, rales to right middle and lower lobe.  Skin:    General: Skin is warm and dry.  Neurological:     Mental Status: She is alert.  Psychiatric:        Mood and Affect: Mood normal.      UC Treatments / Results  Labs (all labs ordered are listed, but only abnormal results are displayed) Labs Reviewed - No data to display  EKG   Radiology DG Chest 2 View Result Date: 06/14/2024 EXAM: 2 VIEW(S) XRAY OF THE CHEST 06/14/2024 02:18:31 PM COMPARISON: 04/13/2015 CLINICAL HISTORY: cough, wheezing FINDINGS: LUNGS AND PLEURA: No focal pulmonary opacity. No pleural effusion. No pneumothorax. HEART AND MEDIASTINUM: No acute abnormality of the cardiac and mediastinal silhouettes. BONES AND SOFT TISSUES: No acute osseous abnormality. IMPRESSION: 1. No acute cardiopulmonary process. Electronically signed by: Franky Crease MD 06/14/2024 02:44 PM EST RP Workstation: [id number]    Procedures Procedures (including critical care time)  Medications Ordered in UC Medications - No data to display  Initial Impression / Assessment and Plan / UC Course  I have reviewed the triage vital signs and the nursing notes.  Pertinent labs & imaging results that were available during my care of the patient were reviewed by me and considered in my medical decision making (see chart for details).     Cough-patient has had cough, postnasal drainage, congestion for over 3 weeks now.  She has been prescribed  antibiotics which did not do anything.  The Mucinex has helped her more than anything.  She does not claim to have any history of allergies but has had some inner ear itching, nasal congestion and allergy type symptoms.  She did have some abnormal lung sounds on exam but the chest x-ray was normal.  Most likely this is due to some mucus in the lungs.  I do not feel any reason why she cannot go to her surgery tomorrow.  Her vital signs are stable and her checks x-ray is normal.  There are no red flags on exam.  I would recommend after surgery starting some allergy medication to include Zyrtec and Flonase .  She can continue the Mucinex. Follow-up with primary care for any continued issues Final Clinical Impressions(s) / UC Diagnoses   Final diagnoses:  Acute cough     Discharge Instructions      Your x-ray was normal.  No concerns today.  I would recommend starting on some allergy medication.  If these lung problems persist she may want to follow-up with a pulmonologist or your primary care for further testing.  I do not see any reason why you cannot have your surgery tomorrow     ED Prescriptions   None    PDMP not reviewed this encounter.   Adah Wilbert LABOR, FNP 06/14/24 1623

## 2024-06-14 NOTE — Discharge Instructions (Signed)
 Your x-ray was normal.  No concerns today.  I would recommend starting on some allergy medication.  If these lung problems persist she may want to follow-up with a pulmonologist or your primary care for further testing.  I do not see any reason why you cannot have your surgery tomorrow

## 2024-06-14 NOTE — ED Triage Notes (Signed)
 Pt c/o cough, nasal congestion, and left ear itching for the last 3 weeks. She was seen previously by a different urgent care and was prescribed Levaquin for 1 week and she test neg for covid. She has also taken robitussin and mucinex with slight relief. She is scheduled to have a spinal stenosis performed tomorrow and wanted to get checked out to make sure she was ok enough for the procedure. She also reports several cold sores and a sore in her nose that she would like evaluated as well.
